# Patient Record
Sex: Female | Born: 1970 | Race: Black or African American | Hispanic: No | Marital: Married | Smoking: Never smoker
Health system: Southern US, Community
[De-identification: ages and names within clinical notes are randomized; demographics above are authoritative.]

## PROBLEM LIST (undated history)

## (undated) ENCOUNTER — Emergency Department (HOSPITAL_COMMUNITY): Admission: EM | Payer: BC Managed Care – PPO

## (undated) DIAGNOSIS — J45909 Unspecified asthma, uncomplicated: Secondary | ICD-10-CM

## (undated) DIAGNOSIS — N809 Endometriosis, unspecified: Secondary | ICD-10-CM

## (undated) DIAGNOSIS — R519 Headache, unspecified: Secondary | ICD-10-CM

## (undated) DIAGNOSIS — F419 Anxiety disorder, unspecified: Secondary | ICD-10-CM

## (undated) DIAGNOSIS — N979 Female infertility, unspecified: Secondary | ICD-10-CM

## (undated) DIAGNOSIS — R87619 Unspecified abnormal cytological findings in specimens from cervix uteri: Secondary | ICD-10-CM

## (undated) DIAGNOSIS — K56609 Unspecified intestinal obstruction, unspecified as to partial versus complete obstruction: Secondary | ICD-10-CM

## (undated) DIAGNOSIS — D649 Anemia, unspecified: Secondary | ICD-10-CM

## (undated) HISTORY — DX: Female infertility, unspecified: N97.9

## (undated) HISTORY — DX: Unspecified intestinal obstruction, unspecified as to partial versus complete obstruction: K56.609

## (undated) HISTORY — PX: OTHER SURGICAL HISTORY: SHX169

## (undated) HISTORY — DX: Anemia, unspecified: D64.9

## (undated) HISTORY — PX: SMALL INTESTINE SURGERY: SHX150

## (undated) HISTORY — PX: SALPINGECTOMY: SHX328

## (undated) HISTORY — DX: Unspecified asthma, uncomplicated: J45.909

## (undated) HISTORY — DX: Unspecified abnormal cytological findings in specimens from cervix uteri: R87.619

---

## 2006-10-28 ENCOUNTER — Other Ambulatory Visit: Admission: RE | Admit: 2006-10-28 | Discharge: 2006-10-28 | Payer: Self-pay | Admitting: Obstetrics and Gynecology

## 2007-12-06 ENCOUNTER — Other Ambulatory Visit: Admission: RE | Admit: 2007-12-06 | Discharge: 2007-12-06 | Payer: Self-pay | Admitting: Obstetrics and Gynecology

## 2010-09-23 ENCOUNTER — Other Ambulatory Visit: Payer: Self-pay | Admitting: Certified Nurse Midwife

## 2010-09-23 DIAGNOSIS — Z1231 Encounter for screening mammogram for malignant neoplasm of breast: Secondary | ICD-10-CM

## 2010-09-26 ENCOUNTER — Ambulatory Visit (HOSPITAL_COMMUNITY)
Admission: RE | Admit: 2010-09-26 | Discharge: 2010-09-26 | Disposition: A | Discharge status: Home / Self Care | Payer: BC Managed Care – PPO | Source: Ambulatory Visit | Attending: Certified Nurse Midwife | Admitting: Certified Nurse Midwife

## 2010-09-26 DIAGNOSIS — Z1231 Encounter for screening mammogram for malignant neoplasm of breast: Secondary | ICD-10-CM | POA: Insufficient documentation

## 2011-09-19 ENCOUNTER — Other Ambulatory Visit: Payer: Self-pay | Admitting: Certified Nurse Midwife

## 2011-09-19 DIAGNOSIS — Z1231 Encounter for screening mammogram for malignant neoplasm of breast: Secondary | ICD-10-CM

## 2011-10-15 ENCOUNTER — Ambulatory Visit (HOSPITAL_COMMUNITY)
Admission: RE | Admit: 2011-10-15 | Discharge: 2011-10-15 | Disposition: A | Discharge status: Home / Self Care | Payer: BC Managed Care – PPO | Source: Ambulatory Visit | Attending: Certified Nurse Midwife | Admitting: Certified Nurse Midwife

## 2011-10-15 DIAGNOSIS — Z1231 Encounter for screening mammogram for malignant neoplasm of breast: Secondary | ICD-10-CM | POA: Insufficient documentation

## 2011-10-21 ENCOUNTER — Other Ambulatory Visit: Payer: Self-pay | Admitting: Certified Nurse Midwife

## 2011-10-21 DIAGNOSIS — R928 Other abnormal and inconclusive findings on diagnostic imaging of breast: Secondary | ICD-10-CM

## 2011-10-24 ENCOUNTER — Ambulatory Visit
Admission: RE | Admit: 2011-10-24 | Discharge: 2011-10-24 | Disposition: A | Discharge status: Home / Self Care | Payer: BC Managed Care – PPO | Source: Ambulatory Visit | Attending: Certified Nurse Midwife | Admitting: Certified Nurse Midwife

## 2011-10-24 DIAGNOSIS — R928 Other abnormal and inconclusive findings on diagnostic imaging of breast: Secondary | ICD-10-CM

## 2012-06-02 ENCOUNTER — Telehealth: Payer: Self-pay | Admitting: Certified Nurse Midwife

## 2012-06-02 NOTE — Telephone Encounter (Signed)
She can ask the pharmacy to fill generic and they will offer what is available

## 2012-06-02 NOTE — Telephone Encounter (Signed)
Patient calling to see if there is a generic rx for microgestin?/please advise/[facility] [address]

## 2012-06-03 NOTE — Telephone Encounter (Signed)
Pt is aware and will ask pharmacy for generic.

## 2012-06-28 ENCOUNTER — Other Ambulatory Visit: Payer: Self-pay | Admitting: *Deleted

## 2012-06-28 MED ORDER — NORETHIN ACE-ETH ESTRAD-FE 1.5-30 MG-MCG PO TABS: 1.0000 | ORAL_TABLET | Freq: Every day | ORAL | Status: DC

## 2012-06-28 NOTE — Telephone Encounter (Signed)
Faxed refill request received from pharmacy for LOESTRIN FE 1.5/30  Last filled by MD on 10/21/11, #28 X 1YEAR Last AEX - 10/21/11 Next AEX - 10/21/12 Pt converting to mail order pharmacy.  90 day supply sent.

## 2012-10-04 ENCOUNTER — Encounter: Payer: Self-pay | Admitting: Certified Nurse Midwife

## 2012-10-15 ENCOUNTER — Telehealth: Payer: Self-pay

## 2012-10-15 NOTE — Telephone Encounter (Signed)
10-24-11 pt went for left breast u/s & they wanted pt to come back in for mth f/u.[facility],pt has not come in for that. I called the patient & lmtcb

## 2012-10-15 NOTE — Telephone Encounter (Signed)
Pt states he insurance had changed & she never made an appt to get it done. 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 Pt has aex on 10/26/12 & will discuss this with D,Leonard at her appt

## 2012-10-21 ENCOUNTER — Ambulatory Visit: Payer: Self-pay | Admitting: Certified Nurse Midwife

## 2012-10-26 ENCOUNTER — Telehealth: Payer: Self-pay

## 2012-10-26 ENCOUNTER — Ambulatory Visit: Payer: Self-pay | Admitting: Certified Nurse Midwife

## 2012-10-26 NOTE — Telephone Encounter (Signed)
See previous phone note from 10-14-12 for documetation that patient was contacted regarding the MMG.

## 2012-10-26 NOTE — Telephone Encounter (Signed)
MMG letter to your office to sign.  Agree?

## 2012-10-26 NOTE — Telephone Encounter (Signed)
See next phone note from 10-26-12.  Patient did not come in today for AEX.

## 2012-10-26 NOTE — Telephone Encounter (Signed)
Pt is past due for mammo. Pt did not come for aex today to discuss her failure to follow up for mammo. Pt cancelled.

## 2012-10-29 ENCOUNTER — Encounter: Payer: Self-pay | Admitting: Obstetrics & Gynecology

## 2012-11-01 NOTE — Telephone Encounter (Signed)
Letter written and mailed 10/30/12.

## 2012-11-23 ENCOUNTER — Encounter: Payer: Self-pay | Admitting: Certified Nurse Midwife

## 2012-11-25 ENCOUNTER — Encounter: Payer: Self-pay | Admitting: Certified Nurse Midwife

## 2012-11-25 ENCOUNTER — Ambulatory Visit: Payer: Self-pay | Admitting: Certified Nurse Midwife

## 2012-12-13 ENCOUNTER — Ambulatory Visit: Payer: Self-pay | Admitting: Certified Nurse Midwife

## 2012-12-14 ENCOUNTER — Ambulatory Visit: Payer: Self-pay | Admitting: Certified Nurse Midwife

## 2012-12-15 ENCOUNTER — Ambulatory Visit: Payer: Self-pay | Admitting: Certified Nurse Midwife

## 2012-12-16 ENCOUNTER — Encounter: Payer: Self-pay | Admitting: Certified Nurse Midwife

## 2012-12-16 ENCOUNTER — Ambulatory Visit (INDEPENDENT_AMBULATORY_CARE_PROVIDER_SITE_OTHER): Payer: BC Managed Care – PPO | Admitting: Certified Nurse Midwife

## 2012-12-16 VITALS — BP 114/68 | HR 62 | Resp 16 | Ht 64.5 in | Wt 142.0 lb

## 2012-12-16 DIAGNOSIS — Z309 Encounter for contraceptive management, unspecified: Secondary | ICD-10-CM

## 2012-12-16 DIAGNOSIS — N9489 Other specified conditions associated with female genital organs and menstrual cycle: Secondary | ICD-10-CM

## 2012-12-16 DIAGNOSIS — Z0000 Encounter for general adult medical examination without abnormal findings: Secondary | ICD-10-CM

## 2012-12-16 DIAGNOSIS — Z01419 Encounter for gynecological examination (general) (routine) without abnormal findings: Secondary | ICD-10-CM

## 2012-12-16 LAB — POCT URINALYSIS DIPSTICK

## 2012-12-16 MED ORDER — DROSPIRENONE-ETHINYL ESTRADIOL 3-0.02 MG PO TABS: 1.0000 | ORAL_TABLET | Freq: Every day | ORAL | Status: DC

## 2012-12-16 NOTE — Patient Instructions (Signed)

## 2012-12-16 NOTE — Progress Notes (Signed)
42 y.o. G2P0020 Single African American Fe here for annual exam. Periods normal. Patient complaining of midcycle pain with 1-2 two weeks duration off and on in past few months. Takes OTC for discomfort 8 Advil with relief. Denies bleeding when occurs. Describes pain as "crampy". No nausea or diarrhea with. Patient also wants to change OCP back to [facility], she felt her PMS was better on. No STD screening desired. Sees PCP prn. No other health issues today. Denies UTI symptoms or vaginal symptoms today. Patient aware she was to have breast follow up with concern of asymetry noted. Patient to schedule today. Declines our office scheduling.  Patient's last menstrual period was 11/27/2012.          Sexually active: yes  The current method of family planning is OCP (estrogen/progesterone).    Exercising: yes  The patient does not participate in regular exercise at present. Smoker:  no  Health Maintenance: Pap:  10/21/11 NEG HR HPV MMG:  10/24/11 BI-Rads 3 Colonoscopy:  none BMD:   none TDaP:  10/21/2011 Labs: Hgb: unable to draw ;  Urine: Leuks 2     Past Medical History  Diagnosis Date  . Asthma     exercise induced  . Anemia   . Infertility, female     Past Surgical History  Procedure Laterality Date  . Salpingectomy      left due to ectopic  . Other surgical history      salpingo occluded rt side, repair?    Current Outpatient Prescriptions  Medication Sig Dispense Refill  . Multiple Vitamins-Minerals (MULTIVITAMIN PO) Take by mouth daily.      . norethindrone-ethinyl estradiol-iron (MICROGESTIN FE,GILDESS FE,LOESTRIN FE) 1.5-30 MG-MCG tablet Take 1 tablet by mouth daily.  3 Package  0   No current facility-administered medications for this visit.    Family History  Problem Relation Age of Onset  . Cancer Maternal Aunt     colon  . Cancer Maternal Grandmother     stomach    ROS:  Pertinent items are noted in HPI.  Otherwise, a comprehensive ROS was negative.  Exam:   LMP  11/27/2012    Ht Readings from Last 3 Encounters:  No data found for Ht    General appearance: alert, cooperative and appears stated age Head: Normocephalic, without obvious abnormality, atraumatic Neck: no adenopathy, supple, symmetrical, trachea midline and thyroid normal to inspection and palpation Lungs: clear to auscultation bilaterally negative CVAT Breasts: normal appearance, no masses or tenderness, No nipple retraction or dimpling, No nipple discharge or bleeding, No axillary or supraclavicular adenopathy Heart: regular rate and rhythm Abdomen: soft, non-tender; no masses,  no organomegaly Extremities: extremities normal, atraumatic, no cyanosis or edema Skin: Skin color, texture, turgor normal. No rashes or lesions Lymph nodes: Cervical, supraclavicular, and axillary nodes normal. No abnormal inguinal nodes palpated Neurologic: Grossly normal   Pelvic: External genitalia:  no lesions              Urethra:  normal appearing urethra with no masses, tenderness or lesions, bladder non tender              Bartholin's and Skene's: normal                 Vagina: normal appearing vagina with normal color and discharge, no lesions              Cervix: normal, non tender              Pap taken:  no Bimanual Exam:  Uterus:  mid position              Adnexa: normal adnexa, no mass, fullness, tenderness and but left adnexal difficult to palpate with fullness ? mass               Rectovaginal: Confirms               Anus:  normal sphincter tone, no lesions  A:  Well Woman with normal exam  Contraception OCP change desired back to [facility] for PMS  symptoms  Left adnexal mass ?  Mammogram follow up past due for asymetry  P:   Reviewed health and wellness pertinent to exam  Rx Yaz see order  Reviewed findings and need for evaluation, patient agreeable.  Reviewed pelvic pain warning signs and need to advise. Will schedule PUS and patient will be notified if Dr Farrel Gobble in agreement.  Pap  smear as per guidelines   Mammogram yearly stressed doing follow up pap smear not taken today  counseled on breast self exam, mammography screening, use and side effects of OCP's, adequate intake of calcium and vitamin D, diet and exercise  return annually or prn  An After Visit Summary was printed and given to the patient.

## 2012-12-17 NOTE — Progress Notes (Signed)
Note reviewed, agree with plan.  Brysten Reister, MD  

## 2012-12-20 ENCOUNTER — Other Ambulatory Visit: Payer: Self-pay | Admitting: Certified Nurse Midwife

## 2012-12-20 ENCOUNTER — Telehealth: Payer: Self-pay | Admitting: Certified Nurse Midwife

## 2012-12-20 DIAGNOSIS — Z1231 Encounter for screening mammogram for malignant neoplasm of breast: Secondary | ICD-10-CM

## 2012-12-20 DIAGNOSIS — Z01419 Encounter for gynecological examination (general) (routine) without abnormal findings: Secondary | ICD-10-CM

## 2012-12-20 DIAGNOSIS — Z309 Encounter for contraceptive management, unspecified: Secondary | ICD-10-CM

## 2012-12-20 MED ORDER — DROSPIRENONE-ETHINYL ESTRADIOL 3-0.02 MG PO TABS: 1.0000 | ORAL_TABLET | Freq: Every day | ORAL | Status: DC

## 2012-12-20 NOTE — Telephone Encounter (Signed)
pt states her pharmacy does not have a pres on file for her for generic yaz please call this into [facility] [address]

## 2012-12-20 NOTE — Telephone Encounter (Signed)
12/16/12 rx was sent to [facility] of [facility] rx was resent to [facility] on [address]. Called Pharmacy to make sure rx was received, pharmacy confirmed. LM on patient's VM that rx was sent.

## 2012-12-23 ENCOUNTER — Other Ambulatory Visit: Payer: Self-pay

## 2013-01-04 ENCOUNTER — Other Ambulatory Visit: Payer: Self-pay | Admitting: Certified Nurse Midwife

## 2013-01-04 ENCOUNTER — Ambulatory Visit (HOSPITAL_COMMUNITY)
Admission: RE | Admit: 2013-01-04 | Discharge: 2013-01-04 | Disposition: A | Discharge status: Home / Self Care | Payer: BC Managed Care – PPO | Source: Ambulatory Visit | Attending: Certified Nurse Midwife | Admitting: Certified Nurse Midwife

## 2013-01-04 DIAGNOSIS — N6489 Other specified disorders of breast: Secondary | ICD-10-CM

## 2013-01-04 DIAGNOSIS — Z1231 Encounter for screening mammogram for malignant neoplasm of breast: Secondary | ICD-10-CM

## 2013-01-19 ENCOUNTER — Ambulatory Visit
Admission: RE | Admit: 2013-01-19 | Discharge: 2013-01-19 | Disposition: A | Discharge status: Home / Self Care | Payer: BC Managed Care – PPO | Source: Ambulatory Visit | Attending: Certified Nurse Midwife | Admitting: Certified Nurse Midwife

## 2013-01-19 DIAGNOSIS — N6489 Other specified disorders of breast: Secondary | ICD-10-CM

## 2013-11-21 ENCOUNTER — Other Ambulatory Visit: Payer: Self-pay | Admitting: Certified Nurse Midwife

## 2013-11-21 NOTE — Telephone Encounter (Signed)
Last refilled: 12/20/12 #1/12 refills by Ms. Debbie Last AEX: 12/16/12 with Ms. Debbie Aex scheduled for 12/22/13 with Ms. Debbie Last Mammogram: 01/19/13 Bi-Rads 1 Quincy Carnes 3/0.02 #28/2 refills sent to [facility] to last patient until AEX

## 2013-12-19 ENCOUNTER — Encounter: Payer: Self-pay | Admitting: Certified Nurse Midwife

## 2013-12-22 ENCOUNTER — Ambulatory Visit: Payer: BC Managed Care – PPO | Admitting: Certified Nurse Midwife

## 2014-01-31 ENCOUNTER — Encounter: Payer: Self-pay | Admitting: Certified Nurse Midwife

## 2014-01-31 ENCOUNTER — Ambulatory Visit (INDEPENDENT_AMBULATORY_CARE_PROVIDER_SITE_OTHER): Payer: BC Managed Care – PPO | Admitting: Certified Nurse Midwife

## 2014-01-31 VITALS — BP 110/70 | HR 68 | Resp 16 | Ht 63.25 in | Wt 137.0 lb

## 2014-01-31 DIAGNOSIS — Z3041 Encounter for surveillance of contraceptive pills: Secondary | ICD-10-CM

## 2014-01-31 DIAGNOSIS — Z01419 Encounter for gynecological examination (general) (routine) without abnormal findings: Secondary | ICD-10-CM

## 2014-01-31 DIAGNOSIS — Z124 Encounter for screening for malignant neoplasm of cervix: Secondary | ICD-10-CM

## 2014-01-31 MED ORDER — DROSPIRENONE-ETHINYL ESTRADIOL 3-0.02 MG PO TABS: 1.0000 | ORAL_TABLET | Freq: Every day | ORAL | Status: DC

## 2014-01-31 NOTE — Patient Instructions (Signed)

## 2014-01-31 NOTE — Progress Notes (Signed)
43 y.o. G2P0020 Single African American Fe here for annual exam.  Periods normal no issues. OCP working well. No health issues today. Getting married next year! Establishing with PCP and will have labs then. No health issues today.  Patient's last menstrual period was 01/31/2014.          Sexually active: Yes.    The current method of family planning is OCP (estrogen/progesterone).    Exercising: Yes.    walking Smoker:  no  Health Maintenance: Pap:  10-21-11 neg HPV HR neg MMG:  01-19-13 density category c, birads 1:neg will schedule Colonoscopy: none BMD:   none TDaP:  2013 Labs: none Self breast exam: done monthly   reports that she has never smoked. She does not have any smokeless tobacco history on file. She reports that she does not drink alcohol or use illicit drugs.  Past Medical History  Diagnosis Date  . Asthma     exercise induced  . Anemia   . Infertility, female     Past Surgical History  Procedure Laterality Date  . Salpingectomy      left due to ectopic  . Other surgical history      salpingo occluded rt side, repair?    Current Outpatient Prescriptions  Medication Sig Dispense Refill  . cyclobenzaprine (FLEXERIL) 10 MG tablet as needed.  2  . GIANVI 3-0.02 MG tablet TAKE 1 TABLET BY MOUTH DAILY 28 tablet 6  . meloxicam (MOBIC) 15 MG tablet as needed.  2  . Multiple Vitamins-Minerals (MULTIVITAMIN PO) Take by mouth daily.     No current facility-administered medications for this visit.    Family History  Problem Relation Age of Onset  . Cancer Maternal Aunt     colon  . Cancer Maternal Grandmother     stomach    ROS:  Pertinent items are noted in HPI.  Otherwise, a comprehensive ROS was negative.  Exam:   BP 110/70 mmHg  Pulse 68  Resp 16  Ht 5' 3.25" (1.607 m)  Wt 137 lb (62.143 kg)  BMI 24.06 kg/m2  LMP 01/31/2014 Height: 5' 3.25" (160.7 cm)  Ht Readings from Last 3 Encounters:  01/31/14 5' 3.25" (1.607 m)  12/16/12 5' 4.5" (1.638 m)     General appearance: alert, cooperative and appears stated age Head: Normocephalic, without obvious abnormality, atraumatic Neck: no adenopathy, supple, symmetrical, trachea midline and thyroid normal to inspection and palpation Lungs: clear to auscultation bilaterally Breasts: normal appearance, no masses or tenderness, No nipple retraction or dimpling, No nipple discharge or bleeding, No axillary or supraclavicular adenopathy Heart: regular rate and rhythm Abdomen: soft, non-tender; no masses,  no organomegaly Extremities: extremities normal, atraumatic, no cyanosis or edema Skin: Skin color, texture, turgor normal. No rashes or lesions Lymph nodes: Cervical, supraclavicular, and axillary nodes normal. No abnormal inguinal nodes palpated Neurologic: Grossly normal   Pelvic: External genitalia:  no lesions              Urethra:  normal appearing urethra with no masses, tenderness or lesions              Bartholin's and Skene's: normal                 Vagina: normal appearing vagina with normal color and discharge, no lesions              Cervix: normal,non tender,no lesions              Pap taken: Yes.  Bimanual Exam:  Uterus:  normal size, contour, position, consistency, mobility, non-tender              Adnexa: normal adnexa and no mass, fullness, tenderness               Rectovaginal: Confirms               Anus:  normal sphincter tone, no lesions  A:  Well Woman with normal exam  Contraception OCP desired  Mammogram due plans to schedule  P:   Reviewed health and wellness pertinent to exam  Rx Gianvi see order  Pap smear taken today with HPV reflex   counseled on breast self exam, mammography screening, use and side effects of OCP's, adequate intake of calcium and vitamin D, diet and exercise  return annually or prn  An After Visit Summary was printed and given to the patient.

## 2014-02-01 NOTE — Progress Notes (Signed)
Reviewed personally.  M. Suzanne Ellen Mayol, MD.  

## 2014-02-02 LAB — IPS PAP TEST WITH REFLEX TO HPV

## 2014-07-24 ENCOUNTER — Telehealth: Payer: Self-pay | Admitting: Certified Nurse Midwife

## 2014-07-24 DIAGNOSIS — Z3041 Encounter for surveillance of contraceptive pills: Secondary | ICD-10-CM

## 2014-07-24 MED ORDER — DROSPIRENONE-ETHINYL ESTRADIOL 3-0.02 MG PO TABS: 1.0000 | ORAL_TABLET | Freq: Every day | ORAL | Status: DC

## 2014-07-24 NOTE — Telephone Encounter (Signed)
Spoke with patient. Patient states that she needs new rx for [facility] sent to [facility]. Patient is going to be switching insurance companies and would like to pay cash for prescription. States pharmacy need new rx written for her to be able to pay cash. New rx placed for Gianvi #1 7RF until next aex.  Routing to provider for final review. Patient agreeable to disposition. Will close encounter.

## 2014-07-24 NOTE — Telephone Encounter (Signed)
Patient states she needs to get a new updated prescription refill for medication Gianvi with new insurance and it needs to be sent to [facility] address [address] [place]. Patient ok for cal back

## 2015-02-22 ENCOUNTER — Ambulatory Visit: Payer: BC Managed Care – PPO | Admitting: Certified Nurse Midwife

## 2015-09-28 ENCOUNTER — Encounter: Payer: Self-pay | Admitting: Certified Nurse Midwife

## 2015-09-28 ENCOUNTER — Ambulatory Visit (INDEPENDENT_AMBULATORY_CARE_PROVIDER_SITE_OTHER): Payer: BLUE CROSS/BLUE SHIELD | Admitting: Certified Nurse Midwife

## 2015-09-28 VITALS — BP 102/64 | HR 68 | Resp 16 | Ht 63.75 in | Wt 130.0 lb

## 2015-09-28 DIAGNOSIS — Z3041 Encounter for surveillance of contraceptive pills: Secondary | ICD-10-CM

## 2015-09-28 DIAGNOSIS — Z0000 Encounter for general adult medical examination without abnormal findings: Secondary | ICD-10-CM | POA: Diagnosis not present

## 2015-09-28 DIAGNOSIS — Z01419 Encounter for gynecological examination (general) (routine) without abnormal findings: Secondary | ICD-10-CM | POA: Diagnosis not present

## 2015-09-28 DIAGNOSIS — Z124 Encounter for screening for malignant neoplasm of cervix: Secondary | ICD-10-CM | POA: Diagnosis not present

## 2015-09-28 LAB — LIPID PANEL
CHOLESTEROL: 171 mg/dL (ref 125–200)
HDL: 80 mg/dL (ref >=46)
LDL Cholesterol: 81 mg/dL (ref <130)
TRIGLYCERIDES: 50 mg/dL (ref <150)
Total CHOL/HDL Ratio: 2.1 Ratio (ref <=5.0)
VLDL: 10 mg/dL (ref <30)

## 2015-09-28 LAB — TSH: TSH: 0.97 m[IU]/L

## 2015-09-28 MED ORDER — DROSPIRENONE-ETHINYL ESTRADIOL 3-0.02 MG PO TABS: 1.0000 | ORAL_TABLET | Freq: Every day | ORAL | 12 refills | Status: DC

## 2015-09-28 NOTE — Progress Notes (Signed)
45 y.o. G2P0020 Married  African American Fe here for annual exam. Periods normal, no issues.Contraception working well. Healthy year! Had work related injury with sciatic nerve, which has improved. Sees Urgent Care. No other health issues. Planning vacation in [place] soon. Screening labs if needed.  Patient's last menstrual period was 09/16/2015 (exact date).          Sexually active: Yes.    The current method of family planning is condoms all the time.    Exercising: Yes.    walking Smoker:  no  Health Maintenance: Pap:  01-31-14 neg MMG:  01-19-13 density category c birads 1:neg Colonoscopy:  none BMD:   none TDaP:  2013  Shingles: no Pneumonia: no Hep C and HIV: not done Labs: none Self breast exam: done monthly   reports that she has never smoked. She has never used smokeless tobacco. She reports that she does not drink alcohol or use drugs.  Past Medical History:  Diagnosis Date  . Anemia   . Asthma    exercise induced  . Infertility, female     Past Surgical History:  Procedure Laterality Date  . OTHER SURGICAL HISTORY     salpingo occluded rt side, repair?  Marland Kitchen SALPINGECTOMY     left due to ectopic    Current Outpatient Prescriptions  Medication Sig Dispense Refill  . cyclobenzaprine (FLEXERIL) 10 MG tablet as needed.  2   No current facility-administered medications for this visit.     Family History  Problem Relation Age of Onset  . Cancer Maternal Aunt     colon  . Cancer Maternal Grandmother     stomach    ROS:  Pertinent items are noted in HPI.  Otherwise, a comprehensive ROS was negative.  Exam:   BP 102/64   Pulse 68   Resp 16   Ht 5' 3.75" (1.619 m)   Wt 130 lb (59 kg)   LMP 09/16/2015 (Exact Date)   BMI 22.49 kg/m  Height: 5' 3.75" (161.9 cm) Ht Readings from Last 3 Encounters:  09/28/15 5' 3.75" (1.619 m)  01/31/14 5' 3.25" (1.607 m)  12/16/12 5' 4.5" (1.638 m)    General appearance: alert, cooperative and appears stated age Head:  Normocephalic, without obvious abnormality, atraumatic Neck: no adenopathy, supple, symmetrical, trachea midline and thyroid normal to inspection and palpation Lungs: clear to auscultation bilaterally Breasts: normal appearance, no masses or tenderness, No nipple retraction or dimpling, No nipple discharge or bleeding, No axillary or supraclavicular adenopathy, pendulous breasts Heart: regular rate and rhythm Abdomen: soft, non-tender; no masses,  no organomegaly Extremities: extremities normal, atraumatic, no cyanosis or edema Skin: Skin color, texture, turgor normal. No rashes or lesions Lymph nodes: Cervical, supraclavicular, and axillary nodes normal. No abnormal inguinal nodes palpated Neurologic: Grossly normal   Pelvic: External genitalia:  no lesions              Urethra:  normal appearing urethra with no masses, tenderness or lesions              Bartholin's and Skene's: normal                 Vagina: normal appearing vagina with normal color and discharge, no lesions              Cervix: no cervical motion tenderness, no lesions and normal              Pap taken: Yes.   Bimanual Exam:  Uterus:  normal  size, contour, position, consistency, mobility, non-tender              Adnexa: normal adnexa and no mass, fullness, tenderness               Rectovaginal: Confirms               Anus:  normal sphincter tone, no lesions  Chaperone present: yes  A:  Well Woman with normal exam  Contraception condoms  Screening labs  Mammogram due, patient will schedule  P:   Reviewed health and wellness pertinent to exam  Lab: TSH,Lipid panel, Vitamin D  Pap smear as above with HPVHR   counseled on breast self exam, mammography screening, adequate intake of calcium and vitamin D, diet and exercise  return annually or prn  An After Visit Summary was printed and given to the patient.

## 2015-09-28 NOTE — Patient Instructions (Signed)

## 2015-09-29 LAB — VITAMIN D 25 HYDROXY (VIT D DEFICIENCY, FRACTURES): Vit D, 25-Hydroxy: 12 ng/mL — ABNORMAL LOW (ref 30–100)

## 2015-10-02 ENCOUNTER — Other Ambulatory Visit: Payer: Self-pay

## 2015-10-02 DIAGNOSIS — E559 Vitamin D deficiency, unspecified: Secondary | ICD-10-CM

## 2015-10-02 LAB — IPS PAP TEST WITH HPV

## 2015-10-02 MED ORDER — VITAMIN D (ERGOCALCIFEROL) 1.25 MG (50000 UNIT) PO CAPS: 50000.0000 [IU] | ORAL_CAPSULE | ORAL | 0 refills | Status: DC

## 2015-10-02 NOTE — Progress Notes (Signed)
Encounter reviewed Jill Jertson, MD   

## 2015-11-19 ENCOUNTER — Telehealth: Payer: Self-pay | Admitting: Certified Nurse Midwife

## 2015-11-19 DIAGNOSIS — Z3041 Encounter for surveillance of contraceptive pills: Secondary | ICD-10-CM

## 2015-11-19 MED ORDER — DROSPIRENONE-ETHINYL ESTRADIOL 3-0.02 MG PO TABS: 1.0000 | ORAL_TABLET | Freq: Every day | ORAL | 4 refills | Status: DC

## 2015-11-19 NOTE — Telephone Encounter (Signed)
Medication refill request: Angela Glover Last AEX:  09/28/15 DL Next AEX: 10/03/16 Last MMG (if hormonal medication request): 01/19/13 [id number] negative Refill authorized: 90-day supply request from [facility]

## 2015-11-19 NOTE — Telephone Encounter (Signed)
Patient is calling about a mail order form she sent in 2 weeks ago. She states her prescription for Angela Glover was sent to [facility] but she needs it to go to [facility] order.

## 2016-01-02 ENCOUNTER — Other Ambulatory Visit: Payer: BLUE CROSS/BLUE SHIELD

## 2016-01-03 ENCOUNTER — Other Ambulatory Visit (INDEPENDENT_AMBULATORY_CARE_PROVIDER_SITE_OTHER): Payer: BLUE CROSS/BLUE SHIELD

## 2016-01-03 DIAGNOSIS — E559 Vitamin D deficiency, unspecified: Secondary | ICD-10-CM

## 2016-01-04 LAB — VITAMIN D 25 HYDROXY (VIT D DEFICIENCY, FRACTURES): Vit D, 25-Hydroxy: 47 ng/mL (ref 30–100)

## 2016-01-24 ENCOUNTER — Other Ambulatory Visit: Payer: Self-pay | Admitting: Certified Nurse Midwife

## 2016-01-24 DIAGNOSIS — Z1231 Encounter for screening mammogram for malignant neoplasm of breast: Secondary | ICD-10-CM

## 2016-02-20 ENCOUNTER — Ambulatory Visit
Admission: RE | Admit: 2016-02-20 | Discharge: 2016-02-20 | Disposition: A | Discharge status: Home / Self Care | Payer: Self-pay | Source: Ambulatory Visit | Attending: Certified Nurse Midwife | Admitting: Certified Nurse Midwife

## 2016-02-20 DIAGNOSIS — Z1231 Encounter for screening mammogram for malignant neoplasm of breast: Secondary | ICD-10-CM

## 2016-04-08 ENCOUNTER — Telehealth: Payer: Self-pay | Admitting: Certified Nurse Midwife

## 2016-04-08 NOTE — Telephone Encounter (Signed)
Patient wanted to update her pharmacy in system. [facility] an online pharmacy at [phone].

## 2016-04-08 NOTE — Telephone Encounter (Signed)
Spoke with patient. Called to verify pharmacy, as the telephone number did not match. Pharmacy verified by patient and updated.   Routing to provider for final review. Patient is agreeable to disposition. Will close encounter.

## 2016-05-20 ENCOUNTER — Telehealth: Payer: Self-pay | Admitting: Certified Nurse Midwife

## 2016-05-20 DIAGNOSIS — Z3041 Encounter for surveillance of contraceptive pills: Secondary | ICD-10-CM

## 2016-05-20 MED ORDER — DROSPIRENONE-ETHINYL ESTRADIOL 3-0.02 MG PO TABS: 1.0000 | ORAL_TABLET | Freq: Every day | ORAL | 1 refills | Status: DC

## 2016-05-20 NOTE — Telephone Encounter (Signed)
Spoke with patient. Patient states she has a new mail pharmacy and can not transfer prescription, would like new prescription sent for remaining refills of OCP Gianvi. Advised patient previous RX discontinued at [facility], new order placed for Gianvi #3pkg/1 RF at [facility]. Advised patient to f/u with pharmacy for filling, keep AEX for refills. Patient verbalizes understanding and is agreeable.  Routing to provider for final review. Patient is agreeable to disposition. Will close encounter.

## 2016-05-20 NOTE — Telephone Encounter (Signed)
Patient has a new insurance and is having trouble getting her refills through the mail order pharmacy on file. Patient said  "I need  an "original prescription to submit to my mail order pharmacy"

## 2016-05-23 ENCOUNTER — Ambulatory Visit (INDEPENDENT_AMBULATORY_CARE_PROVIDER_SITE_OTHER): Payer: Worker's Compensation | Admitting: Urgent Care

## 2016-05-23 ENCOUNTER — Encounter: Payer: Self-pay | Admitting: Urgent Care

## 2016-05-23 VITALS — BP 133/88 | HR 69 | Temp 98.2°F | Resp 16 | Ht 63.5 in | Wt 135.4 lb

## 2016-05-23 DIAGNOSIS — Z026 Encounter for examination for insurance purposes: Secondary | ICD-10-CM

## 2016-05-23 DIAGNOSIS — S46911A Strain of unspecified muscle, fascia and tendon at shoulder and upper arm level, right arm, initial encounter: Secondary | ICD-10-CM

## 2016-05-23 DIAGNOSIS — M62838 Other muscle spasm: Secondary | ICD-10-CM | POA: Diagnosis not present

## 2016-05-23 DIAGNOSIS — M25511 Pain in right shoulder: Secondary | ICD-10-CM | POA: Diagnosis not present

## 2016-05-23 MED ORDER — CYCLOBENZAPRINE HCL 5 MG PO TABS: 5.0000 mg | ORAL_TABLET | Freq: Three times a day (TID) | ORAL | 1 refills | Status: DC | PRN

## 2016-05-23 MED ORDER — NAPROXEN SODIUM 550 MG PO TABS: 550.0000 mg | ORAL_TABLET | Freq: Two times a day (BID) | ORAL | 1 refills | Status: DC

## 2016-05-23 NOTE — Progress Notes (Signed)
   MRN: [id number] DOB: [birth year]  Subjective:   Angela Glover is a 46 y.o. female presenting for worker's comp visit.   Reports over-extending her right shoulder while at work today. Patient was sweeping, bent down reaching under conveyer belt and felt a pulling sensation. Started having achy pain that progressed to shooting pain from her trapezius/shoulder up to her lower base of neck. Has tried ibuprofen with minimal relief. Denies bruising, trauma, swelling, redness, warmth.   Angela Glover's medications list, allergies, past medical history and past surgical history were reviewed and excluded from this note due to being a worker's comp case.   Objective:   Vitals: BP 133/88   Pulse 69   Temp 98.2 F (36.8 C) (Oral)   Resp 16   Ht 5' 3.5" (1.613 m)   Wt 135 lb 6.4 oz (61.4 kg)   LMP 05/08/2016   SpO2 100%   BMI 23.61 kg/m   Physical Exam  Constitutional: She is oriented to person, place, and time. She appears well-developed and well-nourished.  Cardiovascular: Normal rate.   Pulmonary/Chest: Effort normal.  Musculoskeletal:       Right shoulder: She exhibits decreased range of motion (external rotation), tenderness (over trapezius) and spasm (over right trapezius). She exhibits no bony tenderness, no swelling, no effusion, no crepitus, no deformity and normal strength.  Patient's pain not amenable to testing using Hawkins, Neer tests.   Neurological: She is alert and oriented to person, place, and time.  Skin: Skin is warm and dry.   Assessment and Plan :   1. Strain of right shoulder, initial encounter 2. Pain in joint of right shoulder 3. Encounter related to worker's compensation claim 4. Trapezius muscle spasm - Physical exam findings are reassuring, will start conservative management, work restrictions. Recheck in 1 week.  Angela Eagles, PA-C [facility] [phone] 05/23/2016 12:04 PM

## 2016-05-23 NOTE — Patient Instructions (Addendum)
Shoulder Pain Many things can cause shoulder pain, including:  An injury to the area.  Overuse of the shoulder.  Arthritis. The source of the pain can be:  Inflammation.  An injury to the shoulder joint.  An injury to a tendon, ligament, or bone. Follow these instructions at home: Take these actions to help with your pain:  Squeeze a soft ball or a foam pad as much as possible. This helps to keep the shoulder from swelling. It also helps to strengthen the arm.  Take over-the-counter and prescription medicines only as told by your health care provider.  If directed, apply ice to the area:  Put ice in a plastic bag.  Place a towel between your skin and the bag.  Leave the ice on for 20 minutes, 2-3 times per day. Stop applying ice if it does not help with the pain.  If you were given a shoulder sling or immobilizer:  Wear it as told.  Remove it to shower or bathe.  Move your arm as little as possible, but keep your hand moving to prevent swelling. Contact a health care provider if:  Your pain gets worse.  Your pain is not relieved with medicines.  New pain develops in your arm, hand, or fingers. Get help right away if:  Your arm, hand, or fingers:  Tingle.  Become numb.  Become swollen.  Become painful.  Turn white or blue. This information is not intended to replace advice given to you by your health care provider. Make sure you discuss any questions you have with your health care provider. Document Released: 11/13/2004 Document Revised: 09/30/2015 Document Reviewed: 05/29/2014 Elsevier Interactive Patient Education  2017 [employer].     IF you received an x-ray today, you will receive an invoice from [facility]. Please contact [facility] at [phone] with questions or concerns regarding your invoice.   IF you received labwork today, you will receive an invoice from [facility]. Please contact [facility] at [phone] with  questions or concerns regarding your invoice.   Our billing staff will not be able to assist you with questions regarding bills from these companies.  You will be contacted with the lab results as soon as they are available. The fastest way to get your results is to activate your My Chart account. Instructions are located on the last page of this paperwork. If you have not heard from [place] regarding the results in 2 weeks, please contact this office.

## 2016-05-30 ENCOUNTER — Other Ambulatory Visit: Payer: Self-pay

## 2016-10-03 ENCOUNTER — Ambulatory Visit: Payer: BLUE CROSS/BLUE SHIELD | Admitting: Certified Nurse Midwife

## 2016-10-16 ENCOUNTER — Ambulatory Visit: Payer: BLUE CROSS/BLUE SHIELD | Admitting: Certified Nurse Midwife

## 2016-10-16 ENCOUNTER — Ambulatory Visit (INDEPENDENT_AMBULATORY_CARE_PROVIDER_SITE_OTHER): Payer: BLUE CROSS/BLUE SHIELD | Admitting: Certified Nurse Midwife

## 2016-10-16 ENCOUNTER — Encounter: Payer: Self-pay | Admitting: Certified Nurse Midwife

## 2016-10-16 ENCOUNTER — Other Ambulatory Visit (HOSPITAL_COMMUNITY)
Admission: RE | Admit: 2016-10-16 | Discharge: 2016-10-16 | Disposition: A | Discharge status: Home / Self Care | Payer: BLUE CROSS/BLUE SHIELD | Source: Ambulatory Visit | Attending: Obstetrics & Gynecology | Admitting: Obstetrics & Gynecology

## 2016-10-16 VITALS — BP 110/70 | HR 68 | Resp 16 | Ht 63.5 in | Wt 134.0 lb

## 2016-10-16 DIAGNOSIS — Z01419 Encounter for gynecological examination (general) (routine) without abnormal findings: Secondary | ICD-10-CM | POA: Diagnosis not present

## 2016-10-16 DIAGNOSIS — Z124 Encounter for screening for malignant neoplasm of cervix: Secondary | ICD-10-CM | POA: Diagnosis not present

## 2016-10-16 DIAGNOSIS — N841 Polyp of cervix uteri: Secondary | ICD-10-CM | POA: Diagnosis not present

## 2016-10-16 DIAGNOSIS — M25569 Pain in unspecified knee: Secondary | ICD-10-CM | POA: Insufficient documentation

## 2016-10-16 DIAGNOSIS — M545 Low back pain, unspecified: Secondary | ICD-10-CM | POA: Insufficient documentation

## 2016-10-16 DIAGNOSIS — K649 Unspecified hemorrhoids: Secondary | ICD-10-CM | POA: Diagnosis not present

## 2016-10-16 DIAGNOSIS — Z3041 Encounter for surveillance of contraceptive pills: Secondary | ICD-10-CM | POA: Diagnosis not present

## 2016-10-16 MED ORDER — DROSPIRENONE-ETHINYL ESTRADIOL 3-0.02 MG PO TABS: 1.0000 | ORAL_TABLET | Freq: Every day | ORAL | 4 refills | Status: DC

## 2016-10-16 NOTE — Patient Instructions (Signed)

## 2016-10-16 NOTE — Progress Notes (Signed)
46 y.o. G2P0020 Married  African American Fe here for annual exam. Amenorrhea with OCP use.OCP working well. Complaining of rectal pressure and pain with standing long period times. Denies diarrhea or constipation. 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 Has been drinking gatorade all day to avoid dehydration. Feels she has been retaining fluid, but has increased water intake and this is better. Sees urgent care if needed. No other health issues today.  Patient's last menstrual period was 10/05/2016 (exact date).          Sexually active: Yes.    The current method of family planning is OCP (estrogen/progesterone).    Exercising: Yes.    walking, run, lifting Smoker:  no  Health Maintenance: Pap:  01-31-14 neg, 09-28-15 neg HPV HR neg History of Abnormal Pap: yes MMG: 02-20-16 category c density birads 1:neg Self Breast exams: yes Colonoscopy:  none BMD:   none TDaP:  2013 Shingles: no Pneumonia: no Hep C and HIV: HIV done with insurance Labs: none   reports that she has never smoked. She has never used smokeless tobacco. She reports that she does not drink alcohol or use drugs.  Past Medical History:  Diagnosis Date  . Abnormal Pap smear of cervix   . Anemia   . Asthma    exercise induced  . Infertility, female     Past Surgical History:  Procedure Laterality Date  . OTHER SURGICAL HISTORY     salpingo occluded rt side, repair?  Marland Kitchen SALPINGECTOMY     left due to ectopic    Current Outpatient Prescriptions  Medication Sig Dispense Refill  . drospirenone-ethinyl estradiol (GIANVI) 3-0.02 MG tablet Take 1 tablet by mouth daily. 3 Package 4   No current facility-administered medications for this visit.     Family History  Problem Relation Age of Onset  . Cancer Maternal Aunt        colon  . Cancer Maternal Grandmother        stomach    ROS:  Pertinent items are noted in HPI.  Otherwise, a comprehensive ROS was negative.  Exam:   BP 110/70    Pulse 68   Resp 16   Ht 5' 3.5" (1.613 m)   Wt 134 lb (60.8 kg)   LMP 10/05/2016 (Exact Date)   BMI 23.36 kg/m  Height: 5' 3.5" (161.3 cm) Ht Readings from Last 3 Encounters:  10/16/16 5' 3.5" (1.613 m)  05/23/16 5' 3.5" (1.613 m)  09/28/15 5' 3.75" (1.619 m)    General appearance: alert, cooperative and appears stated age Head: Normocephalic, without obvious abnormality, atraumatic Neck: no adenopathy, supple, symmetrical, trachea midline and thyroid normal to inspection and palpation Lungs: clear to auscultation bilaterally Breasts: normal appearance, no masses or tenderness, No nipple retraction or dimpling, No nipple discharge or bleeding, No axillary or supraclavicular adenopathy Heart: regular rate and rhythm Abdomen: soft, non-tender; no masses,  no organomegaly Extremities: extremities normal, atraumatic, no cyanosis or edema Skin: Skin color, texture, turgor normal. No rashes or lesions Lymph nodes: Cervical, supraclavicular, and axillary nodes normal. No abnormal inguinal nodes palpated Neurologic: Grossly normal   Pelvic: External genitalia:  no lesions              Urethra:  normal appearing urethra with no masses, tenderness or lesions              Bartholin's and Skene's: normal  Vagina: normal appearing vagina with normal color and discharge, no lesions              Cervix: no cervical motion tenderness and cervical 2 cm polyp noted, friable with  pap taken              Pap taken: Yes.   Bimanual Exam:  Uterus:  normal size, contour, position, consistency, mobility, non-tender              Adnexa: normal adnexa and no mass, fullness, tenderness               Rectovaginal: Confirms               Anus:  normal sphincter tone, no lesions  Chaperone present: yes  A:  Well Woman with normal exam  Contraception OCP desired  Cervical polyp noted  Non thrombosed hemorrhoid noted  P:   Reviewed health and wellness pertinent to exam  Rx Gianvi see  order with instructions  Discussed finding and will need removal. Discussed benign findings usually, but have some bleeding with sexual activity. Questions addressed. Patient will be called with insurance infor and scheduled for removal.  Discussed hemorrhoid finding and need for warm soaks to area, or tub bath, limit prolonged standing or sitting and heavy lifting. Avoid constipation, stool softener if needed. Warning signs of hemorrhoid given and need to advise.  Pap smear: yes   counseled on breast self exam, mammography screening, use and side effects of OCP's, adequate intake of calcium and vitamin D, diet and exercise  return annually or prn  An After Visit Summary was printed and given to the patient.

## 2016-10-17 ENCOUNTER — Telehealth: Payer: Self-pay | Admitting: *Deleted

## 2016-10-17 DIAGNOSIS — N841 Polyp of cervix uteri: Secondary | ICD-10-CM

## 2016-10-17 LAB — CYTOLOGY - PAP: Diagnosis: NEGATIVE

## 2016-10-17 NOTE — Telephone Encounter (Signed)
-----   Message from Regina Eck, CNM sent at 10/16/2016 12:54 PM EDT ----- Please schedule cervical polyp removal for this patient, pap smear pending so schedule out so it is back . Thank you

## 2016-10-17 NOTE — Addendum Note (Signed)
Addended by: Burnice Logan on: 10/17/2016 01:58 PM   Modules accepted: Orders

## 2016-10-17 NOTE — Telephone Encounter (Signed)
Spoke with patient. Call to schedule cervical polyp removal. LMP 10/05/16. OCP for contraceptive. Patient scheduled with Dr. Talbert Nan on 10/27/16 at [facility] to take Motrin 800 mg with food and water one hour before procedure. Patient verbalizes understanding and is agreeable.  Order placed for polypectomy.   Routing to provider for final review. Patient is agreeable to disposition. Will close encounter.   Cc: Lerry Liner; Melvia Heaps, CNM

## 2016-10-27 ENCOUNTER — Encounter: Payer: Self-pay | Admitting: Obstetrics and Gynecology

## 2016-10-27 ENCOUNTER — Ambulatory Visit (INDEPENDENT_AMBULATORY_CARE_PROVIDER_SITE_OTHER): Payer: BLUE CROSS/BLUE SHIELD | Admitting: Obstetrics and Gynecology

## 2016-10-27 VITALS — BP 110/78 | HR 76 | Resp 14 | Wt 135.0 lb

## 2016-10-27 DIAGNOSIS — N946 Dysmenorrhea, unspecified: Secondary | ICD-10-CM

## 2016-10-27 DIAGNOSIS — N841 Polyp of cervix uteri: Secondary | ICD-10-CM

## 2016-10-27 DIAGNOSIS — Z8742 Personal history of other diseases of the female genital tract: Secondary | ICD-10-CM | POA: Diagnosis not present

## 2016-10-27 DIAGNOSIS — N926 Irregular menstruation, unspecified: Secondary | ICD-10-CM

## 2016-10-27 DIAGNOSIS — N949 Unspecified condition associated with female genital organs and menstrual cycle: Secondary | ICD-10-CM

## 2016-10-27 LAB — POCT URINE PREGNANCY: Preg Test, Ur: NEGATIVE

## 2016-10-27 NOTE — Progress Notes (Addendum)
GYNECOLOGY  VISIT   HPI: 46 y.o.   Married  [place] American  female   [phone] with Patient's last menstrual period was 10/05/2016 (exact date).   here for cervical polyp removal. The patient was noted to have a large cervical polyp at her annual exam with Ms Hollice Espy.  The patient is on OCP's. Typically menses q month x 5 days. Saturates a pad in 3 hours. She has had lifelong horrible cramps. Takes 1,600 mg of ibuprofen BID for 3 days of her cycle. 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 No BTB, until this morning when she had a small amount of bleeding.  She is sexually active, one episode of postcoital bleeding.    She was told she had endometriosis at the time of laparotomy for an ectopic pregnancy in 2006.   GYNECOLOGIC HISTORY: Patient's last menstrual period was 10/05/2016 (exact date). Contraception:OCP Menopausal hormone therapy: none         OB History    Gravida Para Term Preterm AB Living   2 0 0 0 2 0   SAB TAB Ectopic Multiple Live Births   1   1             Patient Active Problem List   Diagnosis Date Noted  . Knee pain 10/16/2016  . Low back pain 10/16/2016    Past Medical History:  Diagnosis Date  . Abnormal Pap smear of cervix   . Anemia   . Asthma    exercise induced  . Infertility, female     Past Surgical History:  Procedure Laterality Date  . OTHER SURGICAL HISTORY     salpingo occluded rt side, repair?  Marland Kitchen SALPINGECTOMY     left due to ectopic  She was told she had endometriosis.   Current Outpatient Prescriptions  Medication Sig Dispense Refill  . drospirenone-ethinyl estradiol (GIANVI) 3-0.02 MG tablet Take 1 tablet by mouth daily. 3 Package 4   No current facility-administered medications for this visit.      ALLERGIES: Latex  Family History  Problem Relation Age of Onset  . Cancer Maternal Aunt        colon  . Cancer Maternal Grandmother        stomach    Social  History   Social History  . Marital status: Married    Spouse name: N/A  . Number of children: N/A  . Years of education: N/A   Occupational History  . Not on file.   Social History Main Topics  . Smoking status: Never Smoker  . Smokeless tobacco: Never Used  . Alcohol use No  . Drug use: No  . Sexual activity: Yes    Partners: Male    Birth control/ protection: Pill   Other Topics Concern  . Not on file   Social History Narrative  . No narrative on file    Review of Systems  Constitutional: Negative.   HENT: Negative.   Eyes: Negative.   Respiratory: Negative.   Cardiovascular: Negative.   Gastrointestinal: Negative.   Genitourinary:       Cervical polyp   Musculoskeletal: Negative.   Skin: Negative.   Neurological: Negative.   Psychiatric/Behavioral: Negative.     PHYSICAL EXAMINATION:    BP 110/78 (BP Location: Right Arm, Patient Position: Sitting, Cuff Size: Normal)   Pulse 76   Resp 14   Wt 135 lb (61.2 kg)   LMP 10/05/2016 (  Exact Date)   BMI 23.54 kg/m     General appearance: alert, cooperative and appears stated age  Pelvic: External genitalia:  no lesions              Urethra:  normal appearing urethra with no masses, tenderness or lesions              Bartholins and Skenes: normal                 Vagina: normal appearing vagina with normal color and discharge, no lesions              Cervix: cervical polyp, removed with ringed forceps, no cervical motion tenderness              Bimanual Exam:  Uterus:  retroverted, mobile, normal sized, mildly tender (but currently spotting)              Adnexa: fullness and tenderness in the left adnexa              Rectovaginal: Yes.  .  Confirms.              Anus:  normal sphincter tone, no lesions  Chaperone was present for exam.  ASSESSMENT Cervical polyp Severe dysmenorrhea (life long), taking too much ibuprofen History of endometriosis H/O ectopic, had laparotomy and salpingectomy Adnexal fullness  on the left    PLAN Cervical polypectomy Discussed options of depo-provera (declines), continuous OCP's, mirena IUD, laparoscopy and hysterectomy Will set up a gyn ultrasound Will try and get a copy of her surgery from 2006 Information on the IUD given    An After Visit Summary was printed and given to the patient.  CC: Evalee Mutton, CNM  Addendum: Op note from 2006 reviewed. She had a laparotomy and left salpingectomy for an ectopic pregnancy with lysis of adhesions. The uterus "had some thick filmy changes, consistent with chronic PID. "The right tube was adhered down onto the intestines. There were some hemosiderin appearing particles consistent with possible endometriosis"

## 2016-11-04 ENCOUNTER — Telehealth: Payer: Self-pay | Admitting: Obstetrics and Gynecology

## 2016-11-04 NOTE — Telephone Encounter (Signed)
Patient cancelled ultrasound appointment for 9/25. Came in today thinking it was for this Tuesday. Would like a call to reschedule.

## 2016-11-11 ENCOUNTER — Other Ambulatory Visit: Payer: Self-pay

## 2016-11-11 ENCOUNTER — Other Ambulatory Visit: Payer: Self-pay | Admitting: Obstetrics and Gynecology

## 2016-11-18 ENCOUNTER — Telehealth: Payer: Self-pay | Admitting: Obstetrics and Gynecology

## 2016-11-18 NOTE — Telephone Encounter (Signed)
See previous phone note. Patient is rescheduled for ultrasound on 11/25/16 with Dr Talbert Nan. Patient is aware appointment date, arrival time and cancellation policy. Patient had no further questions. Ok to close   cc: Dr Talbert Nan

## 2016-11-25 ENCOUNTER — Encounter: Payer: Self-pay | Admitting: Obstetrics and Gynecology

## 2016-11-25 ENCOUNTER — Ambulatory Visit (INDEPENDENT_AMBULATORY_CARE_PROVIDER_SITE_OTHER): Payer: BLUE CROSS/BLUE SHIELD | Admitting: Obstetrics and Gynecology

## 2016-11-25 ENCOUNTER — Ambulatory Visit (INDEPENDENT_AMBULATORY_CARE_PROVIDER_SITE_OTHER): Payer: BLUE CROSS/BLUE SHIELD

## 2016-11-25 VITALS — BP 112/60 | HR 72 | Resp 15 | Wt 134.0 lb

## 2016-11-25 DIAGNOSIS — N83202 Unspecified ovarian cyst, left side: Secondary | ICD-10-CM | POA: Diagnosis not present

## 2016-11-25 DIAGNOSIS — N949 Unspecified condition associated with female genital organs and menstrual cycle: Secondary | ICD-10-CM

## 2016-11-25 DIAGNOSIS — Z8742 Personal history of other diseases of the female genital tract: Secondary | ICD-10-CM

## 2016-11-25 DIAGNOSIS — N946 Dysmenorrhea, unspecified: Secondary | ICD-10-CM | POA: Diagnosis not present

## 2016-11-25 DIAGNOSIS — D259 Leiomyoma of uterus, unspecified: Secondary | ICD-10-CM

## 2016-11-25 MED ORDER — IBUPROFEN 800 MG PO TABS: 800.0000 mg | ORAL_TABLET | Freq: Three times a day (TID) | ORAL | 1 refills | Status: DC | PRN

## 2016-11-25 NOTE — Progress Notes (Signed)
GYNECOLOGY  VISIT   HPI: 46 y.o.   Married  [place] American  female   [phone] with Patient's last menstrual period was 11/24/2016.   here for follow up on severe dysmenorrhea. She is taking more than double the recommended amount of ibuprofen with her cycles even on OPC's. She has a known h/o endometriosis and a h/o an ectopic pregnancy (laparotomy and left salpingectomy). She has been spotting since her cervical polyp was removed last month. She is now on her cycle.   GYNECOLOGIC HISTORY: Patient's last menstrual period was 11/24/2016. Contraception:OCP Menopausal hormone therapy: none         OB History    Gravida Para Term Preterm AB Living   2 0 0 0 2 0   SAB TAB Ectopic Multiple Live Births   1   1             Patient Active Problem List   Diagnosis Date Noted  . Knee pain 10/16/2016  . Low back pain 10/16/2016    Past Medical History:  Diagnosis Date  . Abnormal Pap smear of cervix   . Anemia   . Asthma    exercise induced  . Infertility, female     Past Surgical History:  Procedure Laterality Date  . OTHER SURGICAL HISTORY     salpingo occluded rt side, repair?  Marland Kitchen SALPINGECTOMY     left due to ectopic    Current Outpatient Prescriptions  Medication Sig Dispense Refill  . drospirenone-ethinyl estradiol (GIANVI) 3-0.02 MG tablet Take 1 tablet by mouth daily. 3 Package 4   No current facility-administered medications for this visit.      ALLERGIES: Latex  Family History  Problem Relation Age of Onset  . Cancer Maternal Aunt        colon  . Cancer Maternal Grandmother        stomach    Social History   Social History  . Marital status: Married    Spouse name: N/A  . Number of children: N/A  . Years of education: N/A   Occupational History  . Not on file.   Social History Main Topics  . Smoking status: Never Smoker  . Smokeless tobacco: Never Used  . Alcohol use No  . Drug use: No  . Sexual activity: Yes    Partners: Male    Birth  control/ protection: Pill   Other Topics Concern  . Not on file   Social History Narrative  . No narrative on file    Review of Systems  Constitutional: Negative.   HENT: Negative.   Eyes: Negative.   Respiratory: Negative.   Cardiovascular: Negative.   Gastrointestinal: Negative.   Genitourinary:       Irregular menstrual cycles Heavy bleeding   Musculoskeletal: Negative.   Skin: Negative.   Neurological: Negative.   Endo/Heme/Allergies: Negative.   Psychiatric/Behavioral: Negative.     PHYSICAL EXAMINATION:    BP 112/60 (BP Location: Right Arm, Patient Position: Sitting, Cuff Size: Normal)   Pulse 72   Resp 15   Wt 134 lb (60.8 kg)   LMP 11/24/2016   BMI 23.36 kg/m     General appearance: alert, cooperative and appears stated age  Ultrasound images reviewed with the patient  ASSESSMENT Severe dysmenorrhea Fibroid uterus Complex left ovarian cyst, concerning     PLAN Discussed options of depo-provera, continuous OCP's, orlissa, lupron with add back therapy and surgery For now she will continue cyclic OCP's Information on Orlissa given Ibuprofen 800  mg sent, can add tylenol if needed Recommend a f/u ultrasound in 2 months to f/u on her ovarian cyst   An After Visit Summary was printed and given to the patient.  20 minutes face to face time of which over 50% was spent in counseling.   CC: Evalee Mutton, CNM

## 2016-12-02 ENCOUNTER — Telehealth: Payer: Self-pay | Admitting: Certified Nurse Midwife

## 2016-12-02 MED ORDER — YAZ 3-0.02 MG PO TABS: 1.0000 | ORAL_TABLET | Freq: Every day | ORAL | 3 refills | Status: DC

## 2016-12-02 NOTE — Telephone Encounter (Signed)
Patient would like to discuss switching birth control. °

## 2016-12-02 NOTE — Telephone Encounter (Signed)
Yes, please change it for her.

## 2016-12-02 NOTE — Telephone Encounter (Signed)
Spoke with patient. Advised RX for Yaz #3/3RF sent to verified pharmacy on file. Advised to f/u with pharmacy for filling. Patient verbalizes understanding and is agreeable. Will close encounter.

## 2016-12-02 NOTE — Telephone Encounter (Signed)
Spoke with patient. Patient requesting to switch OCP to Brand Yaz. Patient states she started [place] on 11/30/16 and does not feel generic is as effective for cramping and fibroid pain. Patient states she discussed treatment options with Dr. Talbert Nan at last [facility] on 11/25/16 and feels this is the best option for her at this time.   Advised patient will review with Dr. Talbert Nan and return call with recommendations, patient is agreeable.   Pharmacy -[facility]  Dr. Nelson Chimes -ok to send RX for Yaz, brand only #3/3RF?   Cc: Melvia Heaps, CNM

## 2017-01-19 ENCOUNTER — Telehealth: Payer: Self-pay | Admitting: Obstetrics and Gynecology

## 2017-01-19 NOTE — Telephone Encounter (Signed)
Call to patient. Advised calling to review recommendations for follow-up pelvic ultrasound to recheck 3.4 cm complex endometrial cyst. Patient states she has had this issue for years (confirmed this is not for her fibroids) and she cannot keep paying copay every time she needs an ultrasound.  Reviewed noted from Dr Talbert Nan regarding size of cyst, possible endometrioma and need to follow up to monitor for increase or decrease in size. Discussed the small chance of malignant transformation, especially if greater than 5 cm.  Patietn states she will have tho think about this as she has had this since age 46 and she is "not really concerned about this right now." Offered scheduling alternatives at location of choice and patient declined.

## 2017-01-19 NOTE — Telephone Encounter (Signed)
Patient call to cancel scheduled appointment on 01/27/17. Patient advises she did not realize the appointment was for a two month follow up ultrasound. Patient states she does not wish to reschedule. Advised patient I will forward this information to her doctor for review.   Routing to Dr Talbert Nan for review  cc: Triage Nurse

## 2017-01-19 NOTE — Telephone Encounter (Signed)
Please speak with the patient and explain that the reason for the f/u ultrasound is that she had a 3.4 cm complex left ovarian cyst (suspect endometrioma). While an endometrioma can be followed and not necessarily removed, I would recommend that we follow it. This includes short term f/u ultrasound to make sure that it isn't growing or changing and then f/u ultrasound in 6 months, and then yearly. There is small chance of malignant transformation of an endometrioma. If the cyst were to grow (particularly larger than 5 cm), or develop other concerning features, then surgery would be recommended.

## 2017-01-20 NOTE — Telephone Encounter (Signed)
Spoke with patient. PUS rescheduled for 01/27/17 at 1pm with consult to follow at 1:30pm with Dr. Talbert Nan. Patient is agreeable to date and time.   Routing to provider for final review. Patient is agreeable to disposition. Will close encounter.  Cc: Lamont Snowball, RN, Lerry Liner

## 2017-01-27 ENCOUNTER — Other Ambulatory Visit: Payer: Self-pay

## 2017-01-27 ENCOUNTER — Other Ambulatory Visit: Payer: Self-pay | Admitting: Obstetrics and Gynecology

## 2017-01-27 ENCOUNTER — Ambulatory Visit: Payer: BLUE CROSS/BLUE SHIELD | Admitting: Obstetrics and Gynecology

## 2017-01-27 ENCOUNTER — Encounter: Payer: Self-pay | Admitting: Obstetrics and Gynecology

## 2017-01-27 ENCOUNTER — Ambulatory Visit: Payer: BLUE CROSS/BLUE SHIELD

## 2017-01-27 VITALS — BP 100/60 | HR 88 | Resp 14 | Wt 136.0 lb

## 2017-01-27 DIAGNOSIS — N809 Endometriosis, unspecified: Secondary | ICD-10-CM

## 2017-01-27 DIAGNOSIS — N941 Unspecified dyspareunia: Secondary | ICD-10-CM

## 2017-01-27 DIAGNOSIS — N946 Dysmenorrhea, unspecified: Secondary | ICD-10-CM

## 2017-01-27 DIAGNOSIS — N83202 Unspecified ovarian cyst, left side: Secondary | ICD-10-CM

## 2017-01-27 DIAGNOSIS — Z8742 Personal history of other diseases of the female genital tract: Secondary | ICD-10-CM

## 2017-01-27 NOTE — Progress Notes (Signed)
GYNECOLOGY  VISIT   HPI: 46 y.o.   Married  [place] American  female   [phone] with Patient's last menstrual period was 01/19/2017.   here for follow up Severe dysmenorrhea. The patient has a known h/o endometriosis, also with a h/o an ectopic pregnancy (laparotomy and left salpingectomy). At her last visit in 10/18 she had an ultrasound which revealed a fibroid uterus and a 3.4 x 2 complex left ovarian cyst c/w an endometrioma (adherent to the side wall). She is on OCP's to help with cycle control. The last 2 cycles have been tolerable as far as cramps go. Cycles q month x 6 days. Saturates a pad in 6+ hours (much better). Only occasional spotting. Sexually active, intermittent deep dyspareunia.   GYNECOLOGIC HISTORY: Patient's last menstrual period was 01/19/2017. Contraception:OCP Menopausal hormone therapy: none         OB History    Gravida Para Term Preterm AB Living   2 0 0 0 2 0   SAB TAB Ectopic Multiple Live Births   1   1             Patient Active Problem List   Diagnosis Date Noted  . Knee pain 10/16/2016  . Low back pain 10/16/2016    Past Medical History:  Diagnosis Date  . Abnormal Pap smear of cervix   . Anemia   . Asthma    exercise induced  . Infertility, female     Past Surgical History:  Procedure Laterality Date  . OTHER SURGICAL HISTORY     salpingo occluded rt side, repair?  Marland Kitchen SALPINGECTOMY     left due to ectopic    Current Outpatient Medications  Medication Sig Dispense Refill  . ibuprofen (ADVIL,MOTRIN) 800 MG tablet Take 1 tablet (800 mg total) by mouth every 8 (eight) hours as needed. 30 tablet 1  . YAZ 3-0.02 MG tablet Take 1 tablet by mouth daily. 3 Package 3   No current facility-administered medications for this visit.      ALLERGIES: Latex  Family History  Problem Relation Age of Onset  . Cancer Maternal Aunt        colon  . Cancer Maternal Grandmother        stomach    Social History   Socioeconomic History  . Marital  status: Married    Spouse name: Not on file  . Number of children: Not on file  . Years of education: Not on file  . Highest education level: Not on file  Social Needs  . Financial resource strain: Not on file  . Food insecurity - worry: Not on file  . Food insecurity - inability: Not on file  . Transportation needs - medical: Not on file  . Transportation needs - non-medical: Not on file  Occupational History  . Not on file  Tobacco Use  . Smoking status: Never Smoker  . Smokeless tobacco: Never Used  Substance and Sexual Activity  . Alcohol use: No  . Drug use: No  . Sexual activity: Yes    Partners: Male    Birth control/protection: Pill  Other Topics Concern  . Not on file  Social History Narrative  . Not on file    Review of Systems  Constitutional: Negative.   HENT: Negative.   Eyes: Negative.   Respiratory: Negative.   Cardiovascular: Negative.   Gastrointestinal: Negative.   Genitourinary: Negative.   Musculoskeletal: Negative.   Skin: Negative.   Neurological: Negative.   Endo/Heme/Allergies: Negative.  Psychiatric/Behavioral: Negative.     PHYSICAL EXAMINATION:    BP 100/60 (BP Location: Right Arm, Patient Position: Sitting, Cuff Size: Normal)   Pulse 88   Resp 14   Wt 136 lb (61.7 kg)   LMP 01/19/2017   BMI 23.71 kg/m     General appearance: alert, cooperative and appears stated age  Ultrasound images were reviewed with the patient. The left complex ovarian cyst is stable. Fibroid uterus, no changes.   ASSESSMENT Endometriosis Severe dysmenorrhea, currently improved on OCPs H/O abnormal bleeding, resolved since her cervical polyp was removed in 9/18 Complex left ovarian cyst, 3.5 x 2.3 cm, c/w endometrioma Dyspareunia, intermittent and deep    PLAN Continue OCP's Plan f/u ultrasound in 6 months to document stability of the ovarian cyst. If remains stable will space out ultrasound to yearly Due for an annual in 8/18 Recommended she  control the rate and depth of penetration with intercourse. Discussed positions that would lessen her discomfort.  She has ibuprofen and tylenol for pain   An After Visit Summary was printed and given to the patient.

## 2017-02-23 ENCOUNTER — Other Ambulatory Visit: Payer: Self-pay | Admitting: Certified Nurse Midwife

## 2017-02-23 DIAGNOSIS — Z1231 Encounter for screening mammogram for malignant neoplasm of breast: Secondary | ICD-10-CM

## 2017-03-12 ENCOUNTER — Ambulatory Visit
Admission: RE | Admit: 2017-03-12 | Discharge: 2017-03-12 | Disposition: A | Discharge status: Home / Self Care | Payer: BLUE CROSS/BLUE SHIELD | Source: Ambulatory Visit | Attending: Certified Nurse Midwife | Admitting: Certified Nurse Midwife

## 2017-03-12 DIAGNOSIS — Z1231 Encounter for screening mammogram for malignant neoplasm of breast: Secondary | ICD-10-CM

## 2017-03-25 DIAGNOSIS — J45909 Unspecified asthma, uncomplicated: Secondary | ICD-10-CM | POA: Insufficient documentation

## 2017-04-07 ENCOUNTER — Other Ambulatory Visit: Payer: Self-pay | Admitting: Certified Nurse Midwife

## 2017-04-07 NOTE — Telephone Encounter (Signed)
Patient would like a prescription for her [facility] Phone number is [phone] and fax number is [phone].

## 2017-04-07 NOTE — Telephone Encounter (Signed)
Medication refill request: OCP Last AEX:  10/16/16 DL  Next AEX: 10/20/17  Last MMG (if hormonal medication request): 03/12/17 BIRADS 1 negative  Refill authorized: today, please advise

## 2017-04-09 MED ORDER — YAZ 3-0.02 MG PO TABS: 1.0000 | ORAL_TABLET | Freq: Every day | ORAL | 2 refills | Status: DC

## 2017-04-13 ENCOUNTER — Other Ambulatory Visit: Payer: Self-pay | Admitting: Certified Nurse Midwife

## 2017-04-13 NOTE — Telephone Encounter (Signed)
Spoke with patient to confirm OCP currently taking. Patient states she is requesting generic Yaz, drospirenone-ethinyl estradiol.   Patient states RX Yaz sent on 04/09/17, was mailed to her home and is too expensive, can not be returned. Patient states she did not request brand, has never requested Yaz brand and was not called back to confirm Rx requested prior to sending.   Advised patient per telephone encounter dated 12/02/16, patient requested RX for Yaz, brand only to [facility]. Patient states Rx was too expensive, ended up filled as generic. Advised patient refill request was from last OCP on file. Advised patient will review concerns with Nursing Supervisor and return call, patient is agreeable.   Routing to S. Orvan Seen, RN

## 2017-04-13 NOTE — Telephone Encounter (Signed)
Patient call stating she had requested a refill for "generic" Yaz to be sent to [facility]. Patient states the prescription was not sent as a generic. Patient is requesting we send a corrected script to [facility] for the generic form of this medication. Patient states to direct questions to [facility] at [phone], and that she has been working with [employer] to Triage Nurse

## 2017-04-13 NOTE — Telephone Encounter (Signed)
Patient called to check in with the nurse. She said once the prescription has been sent from [place], she has to pay the price for the brand name. This has happened already and she wants [place] to be aware of this.

## 2017-04-14 NOTE — Telephone Encounter (Signed)
Call to patient. Discussed concerns related to brand Yaz being called to mail order pharmacy. Patient expectations not met and apologized for experience; unfortunately, not able to correct this issue with pharmacy now. Can change for future refills. Call disconnected before resolution.

## 2017-04-14 NOTE — Telephone Encounter (Signed)
Called [facility]. Spoke with Lilyan Punt, agreed to particial account credit.  Call to patient. Advised of assistance provided by [facility] and agreed to resolution of situation.  Patient appreciative. See account notes.   Previous Rx for Yaz cancelled and generic RX to [facility].   Routing to provider for final review. Patient agreeable to disposition. Will close encounter.

## 2017-04-15 MED ORDER — DROSPIRENONE-ETHINYL ESTRADIOL 3-0.02 MG PO TABS: 1.0000 | ORAL_TABLET | Freq: Every day | ORAL | 2 refills | Status: DC

## 2017-10-20 ENCOUNTER — Ambulatory Visit: Payer: Self-pay | Admitting: Certified Nurse Midwife

## 2017-11-17 NOTE — Progress Notes (Signed)
47 y.o. G56P0020 Married Black or [place] American Not Hispanic or Latino female here for annual exam. She has a known fibroid uterus and endometriosis. Last ultrasound in 12/18, stable 3.5 cm complex left ovarian cyst. Left ovary adherent to left uterine side wall. H/O left ectopic pregnancy with laparotomy and left salpingectomy. She is on OCP's for cycle control. Doing very well on OCP's. No significant cramping. At her worst she can saturate a super tampon in 8 hours. Dyspareunia has improved.     Period Cycle (Days): 28 Period Duration (Days): 5-6 days Period Pattern: Regular Menstrual Flow: Moderate Menstrual Control: Tampon, Thin pad, Panty liner Menstrual Control Change Freq (Hours): changes pad/tampon every 8 hours Dysmenorrhea: (!) Mild Dysmenorrhea Symptoms: Cramping  Patient's last menstrual period was 11/11/2017 (exact date).          Sexually active: Yes.    The current method of family planning is OCP (estrogen/progesterone).    Exercising: Yes.    walking Smoker:  no  Health Maintenance: Pap:  10/16/2016 normal, 09/28/2015 normal with negative HPV History of abnormal Pap:  Yes MMG:  03/12/2017 Birads 1 negative Colonoscopy: Never TDaP:  10/21/2011 Gardasil: N/A   reports that she has never smoked. She has never used smokeless tobacco. She reports that she does not drink alcohol or use drugs. She has a Scientist, water quality.   Past Medical History:  Diagnosis Date  . Abnormal Pap smear of cervix   . Anemia   . Asthma    exercise induced  . Infertility, female     Past Surgical History:  Procedure Laterality Date  . OTHER SURGICAL HISTORY     salpingo occluded rt side, repair?  Marland Kitchen SALPINGECTOMY     left due to ectopic    Current Outpatient Medications  Medication Sig Dispense Refill  . drospirenone-ethinyl estradiol (YAZ,GIANVI,LORYNA) 3-0.02 MG tablet Take 1 tablet by mouth daily. 3 Package 2  . ibuprofen (ADVIL,MOTRIN) 800 MG tablet Take 1 tablet (800 mg total)  by mouth every 8 (eight) hours as needed. 30 tablet 1   No current facility-administered medications for this visit.     Family History  Problem Relation Age of Onset  . Cancer Maternal Aunt        colon  . Breast cancer Maternal Aunt        does not know age  . Cancer Maternal Grandmother        stomach  . Breast cancer Paternal Aunt     Review of Systems  Constitutional: Negative.   HENT: Negative.   Eyes: Negative.   Respiratory: Negative.   Cardiovascular: Negative.   Gastrointestinal: Negative.   Endocrine: Negative.   Genitourinary: Negative.   Musculoskeletal: Negative.   Skin: Negative.   Allergic/Immunologic: Negative.   Neurological: Negative.   Hematological: Negative.   Psychiatric/Behavioral: Negative.     Exam:   BP 108/64 (BP Location: Right Arm, Patient Position: Sitting, Cuff Size: Normal)   Pulse 80   Ht 5' 4.57" (1.64 m)   Wt 142 lb 6.4 oz (64.6 kg)   LMP 11/11/2017 (Exact Date)   BMI 24.02 kg/m   Weight change: @WEIGHTCHANGE @ Height:   Height: 5' 4.57" (164 cm)  Ht Readings from Last 3 Encounters:  11/23/17 5' 4.57" (1.64 m)  10/16/16 5' 3.5" (1.613 m)  05/23/16 5' 3.5" (1.613 m)    General appearance: alert, cooperative and appears stated age Head: Normocephalic, without obvious abnormality, atraumatic Neck: no adenopathy, supple, symmetrical, trachea midline and thyroid normal to  inspection and palpation Lungs: clear to auscultation bilaterally Cardiovascular: regular rate and rhythm Breasts: normal appearance, no masses or tenderness Abdomen: soft, non-tender; non distended,  no masses,  no organomegaly Extremities: extremities normal, atraumatic, no cyanosis or edema Skin: Skin color, texture, turgor normal. No rashes or lesions Lymph nodes: Cervical, supraclavicular, and axillary nodes normal. No abnormal inguinal nodes palpated Neurologic: Grossly normal   Pelvic: External genitalia:  no lesions              Urethra:  normal  appearing urethra with no masses, tenderness or lesions              Bartholins and Skenes: normal                 Vagina: normal appearing vagina with normal color and discharge, no lesions              Cervix: no cervical motion tenderness and no lesions               Bimanual Exam:  Uterus:  normal size, contour, position, consistency, mobility, non-tender, retroverted and on the left side of the uterus is a suspected pedunculated myoma, ~4-5 cm              Adnexa: no mass, fullness, tenderness               Rectovaginal: Confirms               Anus:  normal sphincter tone, no lesions  Chaperone was present for exam.  A:  Well Woman with normal exam  H/O endometriosis  H/O complex left ovarian cyst, suspected endometrioma. Over due for ultrasound  Fibroid uterus  Cycles, dysmenorrhea and dyspareunia are well controlled on OCP's  P:   No pap this year  Mammogram UTD  IFOB given  Discussed breast self exam  Discussed calcium and vit D intake  Continue OCP's  Return for Gyn ultrasound

## 2017-11-23 ENCOUNTER — Encounter: Payer: Self-pay | Admitting: Obstetrics and Gynecology

## 2017-11-23 ENCOUNTER — Other Ambulatory Visit: Payer: Self-pay

## 2017-11-23 ENCOUNTER — Ambulatory Visit: Payer: BLUE CROSS/BLUE SHIELD | Admitting: Obstetrics and Gynecology

## 2017-11-23 VITALS — BP 108/64 | HR 80 | Ht 64.57 in | Wt 142.4 lb

## 2017-11-23 DIAGNOSIS — Z0000 Encounter for general adult medical examination without abnormal findings: Secondary | ICD-10-CM

## 2017-11-23 DIAGNOSIS — Z1211 Encounter for screening for malignant neoplasm of colon: Secondary | ICD-10-CM

## 2017-11-23 DIAGNOSIS — N83202 Unspecified ovarian cyst, left side: Secondary | ICD-10-CM

## 2017-11-23 DIAGNOSIS — E559 Vitamin D deficiency, unspecified: Secondary | ICD-10-CM | POA: Diagnosis not present

## 2017-11-23 DIAGNOSIS — D259 Leiomyoma of uterus, unspecified: Secondary | ICD-10-CM

## 2017-11-23 DIAGNOSIS — Z01419 Encounter for gynecological examination (general) (routine) without abnormal findings: Secondary | ICD-10-CM

## 2017-11-23 DIAGNOSIS — Z8742 Personal history of other diseases of the female genital tract: Secondary | ICD-10-CM

## 2017-11-23 MED ORDER — DROSPIRENONE-ETHINYL ESTRADIOL 3-0.02 MG PO TABS: 1.0000 | ORAL_TABLET | Freq: Every day | ORAL | 3 refills | Status: DC

## 2017-11-23 NOTE — Patient Instructions (Signed)
EXERCISE AND DIET:  We recommended that you start or continue a regular exercise program for good health. Regular exercise means any activity that makes your heart beat faster and makes you sweat.  We recommend exercising at least 30 minutes per day at least 3 days a week, preferably 4 or 5.  We also recommend a diet low in fat and sugar.  Inactivity, poor dietary choices and obesity can cause diabetes, heart attack, stroke, and kidney damage, among others.    ALCOHOL AND SMOKING:  Women should limit their alcohol intake to no more than 7 drinks/beers/glasses of wine (combined, not each!) per week. Moderation of alcohol intake to this level decreases your risk of breast cancer and liver damage. And of course, no recreational drugs are part of a healthy lifestyle.  And absolutely no smoking or even second hand smoke. Most people know smoking can cause heart and lung diseases, but did you know it also contributes to weakening of your bones? Aging of your skin?  Yellowing of your teeth and nails?  CALCIUM AND VITAMIN D:  Adequate intake of calcium and Vitamin D are recommended.  The recommendations for exact amounts of these supplements seem to change often, but generally speaking 600 mg of calcium (either carbonate or citrate) and 800 units of Vitamin D per day seems prudent. Certain women may benefit from higher intake of Vitamin D.  If you are among these women, your doctor will have told you during your visit.    PAP SMEARS:  Pap smears, to check for cervical cancer or precancers,  have traditionally been done yearly, although recent scientific advances have shown that most women can have pap smears less often.  However, every woman still should have a physical exam from her gynecologist every year. It will include a breast check, inspection of the vulva and vagina to check for abnormal growths or skin changes, a visual exam of the cervix, and then an exam to evaluate the size and shape of the uterus and  ovaries.  And after 46 years of age, a rectal exam is indicated to check for rectal cancers. We will also provide age appropriate advice regarding health maintenance, like when you should have certain vaccines, screening for sexually transmitted diseases, bone density testing, colonoscopy, mammograms, etc.   MAMMOGRAMS:  All women over 40 years old should have a yearly mammogram. Many facilities now offer a "3D" mammogram, which may cost around $50 extra out of pocket. If possible,  we recommend you accept the option to have the 3D mammogram performed.  It both reduces the number of women who will be called back for extra views which then turn out to be normal, and it is better than the routine mammogram at detecting truly abnormal areas.    COLONOSCOPY:  Colonoscopy to screen for colon cancer is recommended for all women at age 50.  We know, you hate the idea of the prep.  We agree, BUT, having colon cancer and not knowing it is worse!!  Colon cancer so often starts as a polyp that can be seen and removed at colonscopy, which can quite literally save your life!  And if your first colonoscopy is normal and you have no family history of colon cancer, most women don't have to have it again for 10 years.  Once every ten years, you can do something that may end up saving your life, right?  We will be happy to help you get it scheduled when you are ready.    Be sure to check your insurance coverage so you understand how much it will cost.  It may be covered as a preventative service at no cost, but you should check your particular policy.      Breast Self-Awareness Breast self-awareness means being familiar with how your breasts look and feel. It involves checking your breasts regularly and reporting any changes to your health care provider. Practicing breast self-awareness is important. A change in your breasts can be a sign of a serious medical problem. Being familiar with how your breasts look and feel allows  you to find any problems early, when treatment is more likely to be successful. All women should practice breast self-awareness, including women who have had breast implants. How to do a breast self-exam One way to learn what is normal for your breasts and whether your breasts are changing is to do a breast self-exam. To do a breast self-exam: Look for Changes  1. Remove all the clothing above your waist. 2. Stand in front of a mirror in a room with good lighting. 3. Put your hands on your hips. 4. Push your hands firmly downward. 5. Compare your breasts in the mirror. Look for differences between them (asymmetry), such as: ? Differences in shape. ? Differences in size. ? Puckers, dips, and bumps in one breast and not the other. 6. Look at each breast for changes in your skin, such as: ? Redness. ? Scaly areas. 7. Look for changes in your nipples, such as: ? Discharge. ? Bleeding. ? Dimpling. ? Redness. ? A change in position. Feel for Changes  Carefully feel your breasts for lumps and changes. It is best to do this while lying on your back on the floor and again while sitting or standing in the shower or tub with soapy water on your skin. Feel each breast in the following way:  Place the arm on the side of the breast you are examining above your head.  Feel your breast with the other hand.  Start in the nipple area and make  inch (2 cm) overlapping circles to feel your breast. Use the pads of your three middle fingers to do this. Apply light pressure, then medium pressure, then firm pressure. The light pressure will allow you to feel the tissue closest to the skin. The medium pressure will allow you to feel the tissue that is a little deeper. The firm pressure will allow you to feel the tissue close to the ribs.  Continue the overlapping circles, moving downward over the breast until you feel your ribs below your breast.  Move one finger-width toward the center of the body.  Continue to use the  inch (2 cm) overlapping circles to feel your breast as you move slowly up toward your collarbone.  Continue the up and down exam using all three pressures until you reach your armpit.  Write Down What You Find  Write down what is normal for each breast and any changes that you find. Keep a written record with breast changes or normal findings for each breast. By writing this information down, you do not need to depend only on memory for size, tenderness, or location. Write down where you are in your menstrual cycle, if you are still menstruating. If you are having trouble noticing differences in your breasts, do not get discouraged. With time you will become more familiar with the variations in your breasts and more comfortable with the exam. How often should I examine my breasts? Examine   your breasts every month. If you are breastfeeding, the best time to examine your breasts is after a feeding or after using a breast pump. If you menstruate, the best time to examine your breasts is 5-7 days after your period is over. During your period, your breasts are lumpier, and it may be more difficult to notice changes. When should I see my health care provider? See your health care provider if you notice:  A change in shape or size of your breasts or nipples.  A change in the skin of your breast or nipples, such as a reddened or scaly area.  Unusual discharge from your nipples.  A lump or thick area that was not there before.  Pain in your breasts.  Anything that concerns you.  This information is not intended to replace advice given to you by your health care provider. Make sure you discuss any questions you have with your health care provider. Document Released: 02/03/2005 Document Revised: 07/12/2015 Document Reviewed: 12/24/2014 Elsevier Interactive Patient Education  2018 [employer]  

## 2017-11-24 LAB — CBC
HEMOGLOBIN: 11.1 g/dL (ref 11.1–15.9)
Hematocrit: 34.3 % (ref 34.0–46.6)
MCH: 25.4 pg — ABNORMAL LOW (ref 26.6–33.0)
MCHC: 32.4 g/dL (ref 31.5–35.7)
MCV: 79 fL (ref 79–97)
PLATELETS: 327 10*3/uL (ref 150–450)
RBC: 4.37 x10E6/uL (ref 3.77–5.28)
RDW: 12 % — ABNORMAL LOW (ref 12.3–15.4)
WBC: 5.4 10*3/uL (ref 3.4–10.8)

## 2017-11-24 LAB — COMPREHENSIVE METABOLIC PANEL
A/G RATIO: 1.9 (ref 1.2–2.2)
ALBUMIN: 4.2 g/dL (ref 3.5–5.5)
ALT: 10 IU/L (ref 0–32)
AST: 14 IU/L (ref 0–40)
Alkaline Phosphatase: 41 IU/L (ref 39–117)
BUN/Creatinine Ratio: 10 (ref 9–23)
BUN: 7 mg/dL (ref 6–24)
CHLORIDE: 106 mmol/L (ref 96–106)
CO2: 21 mmol/L (ref 20–29)
Calcium: 9.4 mg/dL (ref 8.7–10.2)
Creatinine, Ser: 0.71 mg/dL (ref 0.57–1.00)
GFR calc Af Amer: 118 mL/min/{1.73_m2} (ref >59)
GFR calc non Af Amer: 102 mL/min/{1.73_m2} (ref >59)
Globulin, Total: 2.2 g/dL (ref 1.5–4.5)
Glucose: 83 mg/dL (ref 65–99)
Potassium: 4.3 mmol/L (ref 3.5–5.2)
Sodium: 140 mmol/L (ref 134–144)
TOTAL PROTEIN: 6.4 g/dL (ref 6.0–8.5)

## 2017-11-24 LAB — LIPID PANEL
CHOLESTEROL TOTAL: 197 mg/dL (ref 100–199)
Chol/HDL Ratio: 2.7 ratio (ref 0.0–4.4)
HDL: 73 mg/dL (ref >39)
LDL Calculated: 102 mg/dL — ABNORMAL HIGH (ref 0–99)
Triglycerides: 111 mg/dL (ref 0–149)
VLDL Cholesterol Cal: 22 mg/dL (ref 5–40)

## 2017-11-24 LAB — VITAMIN D 25 HYDROXY (VIT D DEFICIENCY, FRACTURES): Vit D, 25-Hydroxy: 20.4 ng/mL — ABNORMAL LOW (ref 30.0–100.0)

## 2017-11-27 LAB — FECAL OCCULT BLOOD, IMMUNOCHEMICAL: FECAL OCCULT BLD: NEGATIVE

## 2017-12-01 ENCOUNTER — Other Ambulatory Visit: Payer: Self-pay | Admitting: Certified Nurse Midwife

## 2017-12-02 ENCOUNTER — Telehealth: Payer: Self-pay | Admitting: Obstetrics and Gynecology

## 2017-12-02 NOTE — Telephone Encounter (Signed)
Patient calling because her birth control prescription was denied.

## 2017-12-02 NOTE — Telephone Encounter (Signed)
Spoke with patient. Patient states she received an e-mail on 12/01/17 from [facility] that OCP refill request was denied.   Advised patient RX for OCP Yaz #[id number] sent to [facility] on 11/23/17. Our office did receive a refill request from [facility] on 10/15 and that was denied, because Rx previously sent.   Instructed patient to f/u with [facility] regarding RX refill and return call to office if any additional assistance is needed. Patient verbalizes understanding and is agreeable.   Encounter closed.

## 2017-12-07 NOTE — Progress Notes (Signed)
GYNECOLOGY  VISIT   HPI: 47 y.o.   Married Black or [place] American Not Hispanic or Latino  female   G2P0020 with Patient's last menstrual period was 12/08/2017.   here for consult following PUS. The patient has a h/o a fibroid uterus and a complex left ovarian cyst (likely endometrioma). She is on Yaz and her cycles are well controlled.   GYNECOLOGIC HISTORY: Patient's last menstrual period was 12/08/2017. Contraception:OCP Menopausal hormone therapy: None        OB History    Gravida  2   Para  0   Term  0   Preterm  0   AB  2   Living  0     SAB  1   TAB      Ectopic  1   Multiple      Live Births                 Patient Active Problem List   Diagnosis Date Noted  . Knee pain 10/16/2016  . Low back pain 10/16/2016    Past Medical History:  Diagnosis Date  . Abnormal Pap smear of cervix   . Anemia   . Asthma    exercise induced  . Infertility, female     Past Surgical History:  Procedure Laterality Date  . OTHER SURGICAL HISTORY     salpingo occluded rt side, repair?  Marland Kitchen SALPINGECTOMY     left due to ectopic    Current Outpatient Medications  Medication Sig Dispense Refill  . drospirenone-ethinyl estradiol (YAZ,GIANVI,LORYNA) 3-0.02 MG tablet Take 1 tablet by mouth daily. 3 Package 3  . ibuprofen (ADVIL,MOTRIN) 800 MG tablet Take 1 tablet (800 mg total) by mouth every 8 (eight) hours as needed. 30 tablet 1   No current facility-administered medications for this visit.      ALLERGIES: Latex  Family History  Problem Relation Age of Onset  . Cancer Maternal Aunt        colon  . Breast cancer Maternal Aunt        does not know age  . Cancer Maternal Grandmother        stomach  . Breast cancer Paternal Aunt     Social History   Socioeconomic History  . Marital status: Married    Spouse name: Not on file  . Number of children: Not on file  . Years of education: Not on file  . Highest education level: Not on file  Occupational  History  . Not on file  Social Needs  . Financial resource strain: Not on file  . Food insecurity:    Worry: Not on file    Inability: Not on file  . Transportation needs:    Medical: Not on file    Non-medical: Not on file  Tobacco Use  . Smoking status: Never Smoker  . Smokeless tobacco: Never Used  Substance and Sexual Activity  . Alcohol use: No  . Drug use: No  . Sexual activity: Yes    Partners: Male    Birth control/protection: Pill  Lifestyle  . Physical activity:    Days per week: Not on file    Minutes per session: Not on file  . Stress: Not on file  Relationships  . Social connections:    Talks on phone: Not on file    Gets together: Not on file    Attends religious service: Not on file    Active member of club or organization: Not on  file    Attends meetings of clubs or organizations: Not on file    Relationship status: Not on file  . Intimate partner violence:    Fear of current or ex partner: Not on file    Emotionally abused: Not on file    Physically abused: Not on file    Forced sexual activity: Not on file  Other Topics Concern  . Not on file  Social History Narrative  . Not on file    Review of Systems  Constitutional: Negative.   HENT: Negative.   Eyes: Negative.   Respiratory: Negative.   Cardiovascular: Negative.   Gastrointestinal: Negative.   Genitourinary: Negative.   Musculoskeletal: Negative.   Skin: Negative.   Neurological: Negative.   Endo/Heme/Allergies: Negative.   Psychiatric/Behavioral: Negative.     PHYSICAL EXAMINATION:    BP 124/80 (BP Location: Right Arm, Patient Position: Sitting, Cuff Size: Normal)   Pulse 72   Wt 140 lb 12.8 oz (63.9 kg)   LMP 12/08/2017   BMI 23.75 kg/m     General appearance: alert, cooperative and appears stated age  Ultrasound images were reviewed with the patient, multiple fibroids, slightly smaller. Left complex ovarian cyst also slightly smaller  ASSESSMENT Left ovarian cyst, likely  endometrioma, slightly smaller on u/s Fibroid uterus, cycles controlled on OCP's    PLAN Continue OCP's F/U for ultrasound in one year, will schedule at her next annual exam   An After Visit Summary was printed and given to the patient.

## 2017-12-08 ENCOUNTER — Ambulatory Visit (INDEPENDENT_AMBULATORY_CARE_PROVIDER_SITE_OTHER): Payer: BLUE CROSS/BLUE SHIELD | Admitting: Obstetrics and Gynecology

## 2017-12-08 ENCOUNTER — Other Ambulatory Visit: Payer: Self-pay

## 2017-12-08 ENCOUNTER — Ambulatory Visit: Payer: BLUE CROSS/BLUE SHIELD

## 2017-12-08 ENCOUNTER — Encounter: Payer: Self-pay | Admitting: Obstetrics and Gynecology

## 2017-12-08 VITALS — BP 124/80 | HR 72 | Wt 140.8 lb

## 2017-12-08 DIAGNOSIS — D259 Leiomyoma of uterus, unspecified: Secondary | ICD-10-CM

## 2017-12-08 DIAGNOSIS — N83202 Unspecified ovarian cyst, left side: Secondary | ICD-10-CM | POA: Diagnosis not present

## 2017-12-08 DIAGNOSIS — Z8742 Personal history of other diseases of the female genital tract: Secondary | ICD-10-CM | POA: Diagnosis not present

## 2017-12-09 ENCOUNTER — Telehealth: Payer: Self-pay

## 2017-12-09 DIAGNOSIS — D259 Leiomyoma of uterus, unspecified: Secondary | ICD-10-CM

## 2017-12-09 DIAGNOSIS — N83202 Unspecified ovarian cyst, left side: Secondary | ICD-10-CM

## 2017-12-09 NOTE — Telephone Encounter (Signed)
Placed in imaging recall. Order also placed for PUS in 1 year for Suzy to defer to ensure this is completed.  Cc: Lerry Liner  Routing to provider and will close encounter.

## 2017-12-09 NOTE — Telephone Encounter (Addendum)
-----   Message from Salvadore Dom, MD sent at 12/08/2017  5:25 PM EDT ----- Can you put her in some kind of recall for a f/u gyn ultrasound in a year?

## 2018-02-03 ENCOUNTER — Other Ambulatory Visit: Payer: Self-pay | Admitting: Emergency Medicine

## 2018-02-23 DIAGNOSIS — H04212 Epiphora due to excess lacrimation, left lacrimal gland: Secondary | ICD-10-CM | POA: Diagnosis not present

## 2018-03-05 ENCOUNTER — Other Ambulatory Visit: Payer: Self-pay | Admitting: Obstetrics and Gynecology

## 2018-03-05 DIAGNOSIS — Z1231 Encounter for screening mammogram for malignant neoplasm of breast: Secondary | ICD-10-CM

## 2018-03-16 ENCOUNTER — Ambulatory Visit
Admission: RE | Admit: 2018-03-16 | Discharge: 2018-03-16 | Disposition: A | Discharge status: Home / Self Care | Payer: BLUE CROSS/BLUE SHIELD | Source: Ambulatory Visit

## 2018-03-16 DIAGNOSIS — Z1231 Encounter for screening mammogram for malignant neoplasm of breast: Secondary | ICD-10-CM | POA: Diagnosis not present

## 2018-04-10 DIAGNOSIS — M5412 Radiculopathy, cervical region: Secondary | ICD-10-CM | POA: Diagnosis not present

## 2018-10-25 ENCOUNTER — Other Ambulatory Visit: Payer: Self-pay | Admitting: Obstetrics and Gynecology

## 2018-10-26 NOTE — Telephone Encounter (Signed)
Medication refill request: Yaz Last AEX:  11/23/2017 Next AEX: 11/25/2018 Last MMG (if hormonal medication request): 03/16/2018 Birads 1 negative Refill authorized: Yaz #[id number] sent to pharmacy on file to get patient to next aex

## 2018-11-08 DIAGNOSIS — M84364A Stress fracture, left fibula, initial encounter for fracture: Secondary | ICD-10-CM | POA: Diagnosis not present

## 2018-11-08 DIAGNOSIS — M19072 Primary osteoarthritis, left ankle and foot: Secondary | ICD-10-CM | POA: Diagnosis not present

## 2018-11-08 DIAGNOSIS — M7752 Other enthesopathy of left foot: Secondary | ICD-10-CM | POA: Diagnosis not present

## 2018-11-08 DIAGNOSIS — M84364D Stress fracture, left fibula, subsequent encounter for fracture with routine healing: Secondary | ICD-10-CM | POA: Diagnosis not present

## 2018-11-15 DIAGNOSIS — M84364D Stress fracture, left fibula, subsequent encounter for fracture with routine healing: Secondary | ICD-10-CM | POA: Diagnosis not present

## 2018-11-24 ENCOUNTER — Other Ambulatory Visit: Payer: Self-pay

## 2018-11-25 ENCOUNTER — Ambulatory Visit (INDEPENDENT_AMBULATORY_CARE_PROVIDER_SITE_OTHER): Payer: BC Managed Care – PPO | Admitting: Obstetrics and Gynecology

## 2018-11-25 ENCOUNTER — Encounter: Payer: Self-pay | Admitting: Obstetrics and Gynecology

## 2018-11-25 VITALS — BP 116/80 | HR 84 | Temp 97.2°F | Resp 12 | Ht 64.0 in | Wt 146.0 lb

## 2018-11-25 DIAGNOSIS — Z01419 Encounter for gynecological examination (general) (routine) without abnormal findings: Secondary | ICD-10-CM

## 2018-11-25 DIAGNOSIS — Z1211 Encounter for screening for malignant neoplasm of colon: Secondary | ICD-10-CM

## 2018-11-25 DIAGNOSIS — Z8742 Personal history of other diseases of the female genital tract: Secondary | ICD-10-CM

## 2018-11-25 DIAGNOSIS — N83202 Unspecified ovarian cyst, left side: Secondary | ICD-10-CM | POA: Diagnosis not present

## 2018-11-25 DIAGNOSIS — L659 Nonscarring hair loss, unspecified: Secondary | ICD-10-CM

## 2018-11-25 DIAGNOSIS — Z0000 Encounter for general adult medical examination without abnormal findings: Secondary | ICD-10-CM

## 2018-11-25 DIAGNOSIS — D259 Leiomyoma of uterus, unspecified: Secondary | ICD-10-CM | POA: Diagnosis not present

## 2018-11-25 DIAGNOSIS — N841 Polyp of cervix uteri: Secondary | ICD-10-CM | POA: Diagnosis not present

## 2018-11-25 DIAGNOSIS — E559 Vitamin D deficiency, unspecified: Secondary | ICD-10-CM

## 2018-11-25 DIAGNOSIS — N946 Dysmenorrhea, unspecified: Secondary | ICD-10-CM

## 2018-11-25 MED ORDER — IBUPROFEN 800 MG PO TABS: 800.0000 mg | ORAL_TABLET | Freq: Three times a day (TID) | ORAL | 1 refills | Status: DC | PRN

## 2018-11-25 MED ORDER — DROSPIRENONE-ETHINYL ESTRADIOL 3-0.02 MG PO TABS: 1.0000 | ORAL_TABLET | Freq: Every day | ORAL | 3 refills | Status: DC

## 2018-11-25 NOTE — Patient Instructions (Signed)
EXERCISE AND DIET:  We recommended that you start or continue a regular exercise program for good health. Regular exercise means any activity that makes your heart beat faster and makes you sweat.  We recommend exercising at least 30 minutes per day at least 3 days a week, preferably 4 or 5.  We also recommend a diet low in fat and sugar.  Inactivity, poor dietary choices and obesity can cause diabetes, heart attack, stroke, and kidney damage, among others.    ALCOHOL AND SMOKING:  Women should limit their alcohol intake to no more than 7 drinks/beers/glasses of wine (combined, not each!) per week. Moderation of alcohol intake to this level decreases your risk of breast cancer and liver damage. And of course, no recreational drugs are part of a healthy lifestyle.  And absolutely no smoking or even second hand smoke. Most people know smoking can cause heart and lung diseases, but did you know it also contributes to weakening of your bones? Aging of your skin?  Yellowing of your teeth and nails?  CALCIUM AND VITAMIN D:  Adequate intake of calcium and Vitamin D are recommended.  The recommendations for exact amounts of these supplements seem to change often, but generally speaking 1,000 mg of calcium (between diet and supplement) and 800 units of Vitamin D per day seems prudent. Certain women may benefit from higher intake of Vitamin D.  If you are among these women, your doctor will have told you during your visit.    PAP SMEARS:  Pap smears, to check for cervical cancer or precancers,  have traditionally been done yearly, although recent scientific advances have shown that most women can have pap smears less often.  However, every woman still should have a physical exam from her gynecologist every year. It will include a breast check, inspection of the vulva and vagina to check for abnormal growths or skin changes, a visual exam of the cervix, and then an exam to evaluate the size and shape of the uterus and  ovaries.  And after 47 years of age, a rectal exam is indicated to check for rectal cancers. We will also provide age appropriate advice regarding health maintenance, like when you should have certain vaccines, screening for sexually transmitted diseases, bone density testing, colonoscopy, mammograms, etc.   MAMMOGRAMS:  All women over 40 years old should have a yearly mammogram. Many facilities now offer a "3D" mammogram, which may cost around $50 extra out of pocket. If possible,  we recommend you accept the option to have the 3D mammogram performed.  It both reduces the number of women who will be called back for extra views which then turn out to be normal, and it is better than the routine mammogram at detecting truly abnormal areas.    COLON CANCER SCREENING: Now recommend starting at age 45. At this time colonoscopy is not covered for routine screening until 50. There are take home tests that can be done between 45-49.   COLONOSCOPY:  Colonoscopy to screen for colon cancer is recommended for all women at age 50.  We know, you hate the idea of the prep.  We agree, BUT, having colon cancer and not knowing it is worse!!  Colon cancer so often starts as a polyp that can be seen and removed at colonscopy, which can quite literally save your life!  And if your first colonoscopy is normal and you have no family history of colon cancer, most women don't have to have it again for   10 years.  Once every ten years, you can do something that may end up saving your life, right?  We will be happy to help you get it scheduled when you are ready.  Be sure to check your insurance coverage so you understand how much it will cost.  It may be covered as a preventative service at no cost, but you should check your particular policy.      Breast Self-Awareness Breast self-awareness means being familiar with how your breasts look and feel. It involves checking your breasts regularly and reporting any changes to your  health care provider. Practicing breast self-awareness is important. A change in your breasts can be a sign of a serious medical problem. Being familiar with how your breasts look and feel allows you to find any problems early, when treatment is more likely to be successful. All women should practice breast self-awareness, including women who have had breast implants. How to do a breast self-exam One way to learn what is normal for your breasts and whether your breasts are changing is to do a breast self-exam. To do a breast self-exam: Look for Changes  1. Remove all the clothing above your waist. 2. Stand in front of a mirror in a room with good lighting. 3. Put your hands on your hips. 4. Push your hands firmly downward. 5. Compare your breasts in the mirror. Look for differences between them (asymmetry), such as: ? Differences in shape. ? Differences in size. ? Puckers, dips, and bumps in one breast and not the other. 6. Look at each breast for changes in your skin, such as: ? Redness. ? Scaly areas. 7. Look for changes in your nipples, such as: ? Discharge. ? Bleeding. ? Dimpling. ? Redness. ? A change in position. Feel for Changes Carefully feel your breasts for lumps and changes. It is best to do this while lying on your back on the floor and again while sitting or standing in the shower or tub with soapy water on your skin. Feel each breast in the following way:  Place the arm on the side of the breast you are examining above your head.  Feel your breast with the other hand.  Start in the nipple area and make  inch (2 cm) overlapping circles to feel your breast. Use the pads of your three middle fingers to do this. Apply light pressure, then medium pressure, then firm pressure. The light pressure will allow you to feel the tissue closest to the skin. The medium pressure will allow you to feel the tissue that is a little deeper. The firm pressure will allow you to feel the tissue  close to the ribs.  Continue the overlapping circles, moving downward over the breast until you feel your ribs below your breast.  Move one finger-width toward the center of the body. Continue to use the  inch (2 cm) overlapping circles to feel your breast as you move slowly up toward your collarbone.  Continue the up and down exam using all three pressures until you reach your armpit.  Write Down What You Find  Write down what is normal for each breast and any changes that you find. Keep a written record with breast changes or normal findings for each breast. By writing this information down, you do not need to depend only on memory for size, tenderness, or location. Write down where you are in your menstrual cycle, if you are still menstruating. If you are having trouble noticing differences   in your breasts, do not get discouraged. With time you will become more familiar with the variations in your breasts and more comfortable with the exam. How often should I examine my breasts? Examine your breasts every month. If you are breastfeeding, the best time to examine your breasts is after a feeding or after using a breast pump. If you menstruate, the best time to examine your breasts is 5-7 days after your period is over. During your period, your breasts are lumpier, and it may be more difficult to notice changes. When should I see my health care provider? See your health care provider if you notice:  A change in shape or size of your breasts or nipples.  A change in the skin of your breast or nipples, such as a reddened or scaly area.  Unusual discharge from your nipples.  A lump or thick area that was not there before.  Pain in your breasts.  Anything that concerns you.  

## 2018-11-25 NOTE — Progress Notes (Signed)
48 y.o. G52P0020 Married Black or [place] American Not Hispanic or Latino female here for annual exam.   She fractured her ankle a few months ago. With covid she was having increased stress, hair loss. Husband was out of work for a while.  The patient has a fibroid uterus and ovarian cyst (likely endometrioma), due for yearly ultrasound. On OCP's for cycle control. The cramps are getting worse again, vary in intensity. She can miss work because of it. Normally she can control the cramps if she takes the Ibuprofen prior to the cramps getting better.  Period Cycle (Days): 28 Period Duration (Days): 6 Period Pattern: Regular Menstrual Flow: Heavy Menstrual Control: Maxi pad Menstrual Control Change Freq (Hours): 2-3 Dysmenorrhea: (!) Severe Dysmenorrhea Symptoms: Cramping  Patient's last menstrual period was 11/15/2018.          Sexually active: Yes.    The current method of family planning is OCP (estrogen/progesterone).    Exercising: No.  The patient does not participate in regular exercise at present. Smoker:  no  Health Maintenance: Pap:  10/16/16 Negative, 09/28/15: negative, negative HPV History of abnormal Pap:  yes MMG:  03/16/18 BIRADS 1 negative/density c BMD:   never Colonoscopy: never TDaP:  10/21/11 Gardasil: n/a   reports that she has never smoked. She has never used smokeless tobacco. She reports that she does not drink alcohol or use drugs. She has a Scientist, water quality.  Past Medical History:  Diagnosis Date  . Abnormal Pap smear of cervix   . Anemia   . Asthma    exercise induced  . Infertility, female     Past Surgical History:  Procedure Laterality Date  . OTHER SURGICAL HISTORY     salpingo occluded rt side, repair?  Marland Kitchen SALPINGECTOMY     left due to ectopic    Current Outpatient Medications  Medication Sig Dispense Refill  . drospirenone-ethinyl estradiol (YAZ) 3-0.02 MG tablet TAKE 1 TABLET DAILY 3 Package 0  . ibuprofen (ADVIL,MOTRIN) 800 MG tablet Take  1 tablet (800 mg total) by mouth every 8 (eight) hours as needed. 30 tablet 1  . meloxicam (MOBIC) 7.5 MG tablet TAKE 1 TABLET BY MOUTH ONCE DAILY FOR 30 DAYS     No current facility-administered medications for this visit.     Family History  Problem Relation Age of Onset  . Cancer Maternal Aunt        colon  . Breast cancer Maternal Aunt        does not know age  . Cancer Maternal Grandmother        stomach  . Breast cancer Paternal Aunt     Review of Systems  Constitutional: Negative.   HENT: Negative.   Eyes: Negative.   Respiratory: Negative.   Cardiovascular: Negative.   Gastrointestinal: Negative.   Endocrine: Negative.   Genitourinary: Negative.   Musculoskeletal: Negative.   Skin: Negative.   Allergic/Immunologic: Negative.   Neurological: Negative.   Hematological: Negative.   Psychiatric/Behavioral: Negative.     Exam:   BP 116/80 (BP Location: Right Arm, Patient Position: Sitting, Cuff Size: Normal)   Pulse 84   Temp (!) 97.2 F (36.2 C) (Temporal)   Resp 12   Ht 5\' 4"  (1.626 m)   Wt 146 lb (66.2 kg)   LMP 11/15/2018   BMI 25.06 kg/m   Weight change: @WEIGHTCHANGE @ Height:   Height: 5\' 4"  (162.6 cm)  Ht Readings from Last 3 Encounters:  11/25/18 5\' 4"  (1.626 m)  11/23/17  5' 4.57" (1.64 m)  10/16/16 5' 3.5" (1.613 m)    General appearance: alert, cooperative and appears stated age Head: Normocephalic, without obvious abnormality, atraumatic Neck: no adenopathy, supple, symmetrical, trachea midline and thyroid normal to inspection and palpation Lungs: clear to auscultation bilaterally Cardiovascular: regular rate and rhythm Breasts: normal appearance, no masses or tenderness Abdomen: soft, non-tender; non distended,  no masses,  no organomegaly Extremities: extremities normal, atraumatic, no cyanosis or edema Skin: Skin color, texture, turgor normal. No rashes or lesions Lymph nodes: Cervical, supraclavicular, and axillary nodes normal. No  abnormal inguinal nodes palpated Neurologic: Grossly normal   Pelvic: External genitalia:  no lesions              Urethra:  normal appearing urethra with no masses, tenderness or lesions              Bartholins and Skenes: normal                 Vagina: normal appearing vagina with normal color and discharge, no lesions              Cervix: cervical polyp, removed with ringed forceps               Bimanual Exam:  Uterus:  retroverted, slighlty enlarged, fibroid felt coming off of left lateral uterus, ~5 cm              Adnexa: no mass, fullness, tenderness               Rectovaginal: Confirms               Anus:  normal sphincter tone, no lesions  Chaperone was present for exam.  A:  Well Woman with normal exam  Cervical polyp  Fibroid uterus  Ovarian cyst  P:   No pap this year  Mammogram UTD  IFOB given  Discussed breast self exam  Discussed calcium and vit D intake  Return for GYN ultrasound  Cervical polyp removed

## 2018-11-26 LAB — COMPREHENSIVE METABOLIC PANEL
ALT: 14 IU/L (ref 0–32)
AST: 22 IU/L (ref 0–40)
Albumin/Globulin Ratio: 1.8 (ref 1.2–2.2)
Albumin: 4.4 g/dL (ref 3.8–4.8)
Alkaline Phosphatase: 45 IU/L (ref 39–117)
BUN/Creatinine Ratio: 12 (ref 9–23)
BUN: 10 mg/dL (ref 6–24)
Bilirubin Total: 0.4 mg/dL (ref 0.0–1.2)
CO2: 21 mmol/L (ref 20–29)
Calcium: 9.6 mg/dL (ref 8.7–10.2)
Chloride: 105 mmol/L (ref 96–106)
Creatinine, Ser: 0.86 mg/dL (ref 0.57–1.00)
GFR calc Af Amer: 93 mL/min/{1.73_m2} (ref >59)
GFR calc non Af Amer: 81 mL/min/{1.73_m2} (ref >59)
Globulin, Total: 2.5 g/dL (ref 1.5–4.5)
Glucose: 83 mg/dL (ref 65–99)
Potassium: 4.3 mmol/L (ref 3.5–5.2)
Sodium: 142 mmol/L (ref 134–144)
Total Protein: 6.9 g/dL (ref 6.0–8.5)

## 2018-11-26 LAB — LIPID PANEL
Chol/HDL Ratio: 2.9 ratio (ref 0.0–4.4)
Cholesterol, Total: 201 mg/dL — ABNORMAL HIGH (ref 100–199)
HDL: 70 mg/dL (ref >39)
LDL Chol Calc (NIH): 113 mg/dL — ABNORMAL HIGH (ref 0–99)
Triglycerides: 104 mg/dL (ref 0–149)
VLDL Cholesterol Cal: 18 mg/dL (ref 5–40)

## 2018-11-26 LAB — VITAMIN D 25 HYDROXY (VIT D DEFICIENCY, FRACTURES): Vit D, 25-Hydroxy: 34.6 ng/mL (ref 30.0–100.0)

## 2018-11-26 LAB — CBC
Hematocrit: 37.5 % (ref 34.0–46.6)
Hemoglobin: 11.9 g/dL (ref 11.1–15.9)
MCH: 26.2 pg — ABNORMAL LOW (ref 26.6–33.0)
MCHC: 31.7 g/dL (ref 31.5–35.7)
MCV: 82 fL (ref 79–97)
Platelets: 376 10*3/uL (ref 150–450)
RBC: 4.55 x10E6/uL (ref 3.77–5.28)
RDW: 12 % (ref 11.7–15.4)
WBC: 5.5 10*3/uL (ref 3.4–10.8)

## 2018-11-26 LAB — TSH: TSH: 0.583 u[IU]/mL (ref 0.450–4.500)

## 2018-11-29 DIAGNOSIS — M84364D Stress fracture, left fibula, subsequent encounter for fracture with routine healing: Secondary | ICD-10-CM | POA: Diagnosis not present

## 2018-11-29 NOTE — Addendum Note (Signed)
Addended by: Dorothy Spark on: 11/29/2018 04:29 PM   Modules accepted: Orders

## 2018-11-30 ENCOUNTER — Other Ambulatory Visit: Payer: Self-pay | Admitting: Obstetrics and Gynecology

## 2018-11-30 DIAGNOSIS — N841 Polyp of cervix uteri: Secondary | ICD-10-CM | POA: Diagnosis not present

## 2018-12-02 NOTE — Progress Notes (Signed)
GYNECOLOGY  VISIT   HPI: 48 y.o.   Married Black or [place] American Not Hispanic or Latino  female   G2P0020 with Patient's last menstrual period was 11/15/2018.   here for consult after PUS   The patient is getting yearly ultrasounds for f/u of a left ovarian cyst (likely endometrioma), she also has a fibroid uterus. At her last ultrasound her complex left ovarian cyst was 2.2 x 1.2 cm.  On OCP's for cycle control.   GYNECOLOGIC HISTORY: Patient's last menstrual period was 11/15/2018. Contraception: OCP Menopausal hormone therapy: None       OB History    Gravida  2   Para  0   Term  0   Preterm  0   AB  2   Living  0     SAB  1   TAB      Ectopic  1   Multiple      Live Births                 Patient Active Problem List   Diagnosis Date Noted  . Knee pain 10/16/2016  . Low back pain 10/16/2016    Past Medical History:  Diagnosis Date  . Abnormal Pap smear of cervix   . Anemia   . Asthma    exercise induced  . Infertility, female     Past Surgical History:  Procedure Laterality Date  . OTHER SURGICAL HISTORY     salpingo occluded rt side, repair?  Marland Kitchen SALPINGECTOMY     left due to ectopic    Current Outpatient Medications  Medication Sig Dispense Refill  . drospirenone-ethinyl estradiol (YAZ) 3-0.02 MG tablet Take 1 tablet by mouth daily. 3 Package 3  . ibuprofen (ADVIL) 800 MG tablet Take 1 tablet (800 mg total) by mouth every 8 (eight) hours as needed. 30 tablet 1  . meloxicam (MOBIC) 7.5 MG tablet TAKE 1 TABLET BY MOUTH ONCE DAILY FOR 30 DAYS     No current facility-administered medications for this visit.      ALLERGIES: Latex  Family History  Problem Relation Age of Onset  . Cancer Maternal Aunt        colon  . Breast cancer Maternal Aunt        does not know age  . Cancer Maternal Grandmother        stomach  . Breast cancer Paternal Aunt     Social History   Socioeconomic History  . Marital status: Married    Spouse  name: Not on file  . Number of children: Not on file  . Years of education: Not on file  . Highest education level: Not on file  Occupational History  . Not on file  Social Needs  . Financial resource strain: Not on file  . Food insecurity    Worry: Not on file    Inability: Not on file  . Transportation needs    Medical: Not on file    Non-medical: Not on file  Tobacco Use  . Smoking status: Never Smoker  . Smokeless tobacco: Never Used  Substance and Sexual Activity  . Alcohol use: No  . Drug use: No  . Sexual activity: Yes    Partners: Male    Birth control/protection: Pill  Lifestyle  . Physical activity    Days per week: Not on file    Minutes per session: Not on file  . Stress: Not on file  Relationships  . Social connections  Talks on phone: Not on file    Gets together: Not on file    Attends religious service: Not on file    Active member of club or organization: Not on file    Attends meetings of clubs or organizations: Not on file    Relationship status: Not on file  . Intimate partner violence    Fear of current or ex partner: Not on file    Emotionally abused: Not on file    Physically abused: Not on file    Forced sexual activity: Not on file  Other Topics Concern  . Not on file  Social History Narrative  . Not on file    Review of Systems  Constitutional: Negative.   HENT: Negative.   Eyes: Negative.   Respiratory: Negative.   Cardiovascular: Negative.   Gastrointestinal: Negative.   Genitourinary: Negative.   Musculoskeletal: Negative.   Skin: Negative.   Neurological: Negative.   Endo/Heme/Allergies: Negative.   Psychiatric/Behavioral: Negative.     PHYSICAL EXAMINATION:    BP 118/78 (BP Location: Right Arm, Patient Position: Sitting, Cuff Size: Normal)   Pulse 76   Temp (!) 97.2 F (36.2 C) (Skin)   LMP 11/15/2018     General appearance: alert, cooperative and appears stated age   Ultrasound images reviewed with the patient.  Her uterus and fibroids are slightly bigger. Her complex left ovarian cyst is slightly smaller at 2.2 x 0.6 cm (down from 2.2 x 1.2 cm)  ASSESSMENT Fibroid uterus, slightly larger, overall symptoms are tolerable Small complex left ovarian cyst, likely endometrioma    PLAN Continue OCPs F/u ultrasound in one year.    An After Visit Summary was printed and given to the patient.

## 2018-12-03 ENCOUNTER — Other Ambulatory Visit: Payer: Self-pay

## 2018-12-07 ENCOUNTER — Ambulatory Visit (INDEPENDENT_AMBULATORY_CARE_PROVIDER_SITE_OTHER): Payer: BC Managed Care – PPO

## 2018-12-07 ENCOUNTER — Ambulatory Visit: Payer: BC Managed Care – PPO | Admitting: Obstetrics and Gynecology

## 2018-12-07 ENCOUNTER — Encounter: Payer: Self-pay | Admitting: Obstetrics and Gynecology

## 2018-12-07 ENCOUNTER — Other Ambulatory Visit: Payer: Self-pay

## 2018-12-07 VITALS — BP 118/78 | HR 76 | Temp 97.2°F

## 2018-12-07 DIAGNOSIS — D259 Leiomyoma of uterus, unspecified: Secondary | ICD-10-CM

## 2018-12-07 DIAGNOSIS — N83202 Unspecified ovarian cyst, left side: Secondary | ICD-10-CM

## 2018-12-09 DIAGNOSIS — M84364D Stress fracture, left fibula, subsequent encounter for fracture with routine healing: Secondary | ICD-10-CM | POA: Diagnosis not present

## 2019-01-03 ENCOUNTER — Other Ambulatory Visit: Payer: Self-pay | Admitting: Obstetrics and Gynecology

## 2019-01-05 LAB — FECAL OCCULT BLOOD, IMMUNOCHEMICAL: Fecal Occult Bld: NEGATIVE

## 2019-01-05 LAB — SPECIMEN STATUS REPORT

## 2019-01-17 ENCOUNTER — Other Ambulatory Visit: Payer: Self-pay | Admitting: Obstetrics and Gynecology

## 2019-01-24 ENCOUNTER — Other Ambulatory Visit: Payer: Self-pay | Admitting: Obstetrics and Gynecology

## 2019-01-24 MED ORDER — DROSPIRENONE-ETHINYL ESTRADIOL 3-0.02 MG PO TABS: 1.0000 | ORAL_TABLET | Freq: Every day | ORAL | 3 refills | Status: DC

## 2019-01-24 NOTE — Telephone Encounter (Signed)
Medication refill request: yaz Last AEX:  11-25-2018 JJ  Next AEX: not currently scheduled  Last MMG (if hormonal medication request): 03-16-2018 density C/BIRADS 1 negative  Refill authorized: Today, please advise.   Call to patient. Patient requesting refill of yaz to [facility]. Medication pended for #3, 3RF. Please refill if appropriate.   Patient also states she had discussed possible surgery for fibroids with Dr. Talbert Nan at last appointment. Interested in proceeding. Consult to discuss surgery options scheduled for 02-14-2019 at 0800. Patient agreeable to date and time of appointment.

## 2019-01-24 NOTE — Telephone Encounter (Signed)
Patient is calling regarding refill being sent to [facility]. Patient is needed it transferred to [place]. Patient stated that there is a form that needs to be filled out for [facility] and this cannot be done over the phone.

## 2019-02-07 NOTE — Progress Notes (Signed)
Virtual Visit via Video Note  I connected with Angela Glover on 02/14/19 at  8:00 AM EST by a video enabled telemedicine application and verified that I am speaking with the correct person using two identifiers.  Location: Patient: Home Provider: Office at [facility]   I discussed the limitations of evaluation and management by telemedicine and the availability of in person appointments. The patient expressed understanding and agreed to proceed.    GYNECOLOGY  VISIT   HPI: 48 y.o.   Married Black or [place] American Not Hispanic or Latino  female   [phone] with No LMP recorded.   Virtual visit to discuss hysterectomy. She has a known fibroid uterus and endometriosis. She is being followed with serial ultrasounds for a stable, 2.2 cm, complex left ovarian cyst (suspected endometrioma). It appears adherent to the left uterine side wall on ultrasound.  Her last ultrasound was in 10/20 and showed a fibroid uterus, 7 fibroids were measured ranging 1.1 cm to 5.4 cm. She has a h/o a left ectopic pregnancy with laparotomy and left salpingectomy.   She has been on OCP's for cycle control, but her cramps have gotten progressively worse. She has been missing work secondary to the severity of her cramps. She would like definitive treatment.   GYNECOLOGIC HISTORY: No LMP recorded. Contraception:OCP's Menopausal hormone therapy: none        OB History    Gravida  2   Para  0   Term  0   Preterm  0   AB  2   Living  0     SAB  1   TAB      Ectopic  1   Multiple      Live Births                 Patient Active Problem List   Diagnosis Date Noted  . Knee pain 10/16/2016  . Low back pain 10/16/2016    Past Medical History:  Diagnosis Date  . Abnormal Pap smear of cervix   . Anemia   . Asthma    exercise induced  . Infertility, female     Past Surgical History:  Procedure Laterality Date  . OTHER SURGICAL HISTORY     salpingo  occluded rt side, repair?  Marland Kitchen SALPINGECTOMY     left due to ectopic  one tube removed with ectopic, some scar tissue was removed.   Current Outpatient Medications  Medication Sig Dispense Refill  . drospirenone-ethinyl estradiol (YAZ) 3-0.02 MG tablet Take 1 tablet by mouth daily. 3 Package 3  . ibuprofen (ADVIL) 800 MG tablet Take 1 tablet (800 mg total) by mouth every 8 (eight) hours as needed. 30 tablet 1  . meloxicam (MOBIC) 7.5 MG tablet TAKE 1 TABLET BY MOUTH ONCE DAILY FOR 30 DAYS     No current facility-administered medications for this visit.     ALLERGIES: Latex  Family History  Problem Relation Age of Onset  . Cancer Maternal Aunt        colon  . Breast cancer Maternal Aunt        does not know age  . Cancer Maternal Grandmother        stomach  . Breast cancer Paternal Aunt     Social History   Socioeconomic History  . Marital status: Married    Spouse name: Not on file  . Number of children: Not on file  . Years of education: Not on file  .  Highest education level: Not on file  Occupational History  . Not on file  Tobacco Use  . Smoking status: Never Smoker  . Smokeless tobacco: Never Used  Substance and Sexual Activity  . Alcohol use: No  . Drug use: No  . Sexual activity: Yes    Partners: Male    Birth control/protection: Pill  Other Topics Concern  . Not on file  Social History Narrative  . Not on file   Social Determinants of Health   Financial Resource Strain:   . Difficulty of Paying Living Expenses: Not on file  Food Insecurity:   . Worried About Charity fundraiser in the Last Year: Not on file  . Ran Out of Food in the Last Year: Not on file  Transportation Needs:   . Lack of Transportation (Medical): Not on file  . Lack of Transportation (Non-Medical): Not on file  Physical Activity:   . Days of Exercise per Week: Not on file  . Minutes of Exercise per Session: Not on file  Stress:   . Feeling of Stress : Not on file  Social  Connections:   . Frequency of Communication with Friends and Family: Not on file  . Frequency of Social Gatherings with Friends and Family: Not on file  . Attends Religious Services: Not on file  . Active Member of Clubs or Organizations: Not on file  . Attends Archivist Meetings: Not on file  . Marital Status: Not on file  Intimate Partner Violence:   . Fear of Current or Ex-Partner: Not on file  . Emotionally Abused: Not on file  . Physically Abused: Not on file  . Sexually Abused: Not on file    ROS  PHYSICAL EXAMINATION:    There were no vitals taken for this visit.    General appearance: alert, cooperative and appears stated age  ASSESSMENT Complex left ovarian cyst, suspected endometrioma H/O endometriosis Fibroid uterus Severe dysmenorrhea H/O laparotomy    PLAN Discussed total laparoscopic hysterectomy, bilateral salpingectomies, left oophorectomy vs cystectomy and cystoscopy. Reviewed the risks of the procedure, including infection, bleeding, damage to bowel/badder/vessels/ureters. Discussed increased risk of scar tissue and injury with her h/o endometriosis and prior laparotomy. Discussed post operative recovery and risk of cuff dehiscence. All of her questions were answered    I provided ~10 minutes of non-face-to-face time during this encounter.   Salvadore Dom, MD

## 2019-02-14 ENCOUNTER — Telehealth (INDEPENDENT_AMBULATORY_CARE_PROVIDER_SITE_OTHER): Payer: BC Managed Care – PPO | Admitting: Obstetrics and Gynecology

## 2019-02-14 ENCOUNTER — Other Ambulatory Visit: Payer: Self-pay

## 2019-02-14 DIAGNOSIS — N946 Dysmenorrhea, unspecified: Secondary | ICD-10-CM | POA: Diagnosis not present

## 2019-02-14 DIAGNOSIS — D259 Leiomyoma of uterus, unspecified: Secondary | ICD-10-CM | POA: Diagnosis not present

## 2019-02-14 DIAGNOSIS — N83202 Unspecified ovarian cyst, left side: Secondary | ICD-10-CM

## 2019-02-14 DIAGNOSIS — Z8742 Personal history of other diseases of the female genital tract: Secondary | ICD-10-CM

## 2019-02-15 ENCOUNTER — Encounter: Payer: Self-pay | Admitting: Obstetrics and Gynecology

## 2019-02-22 ENCOUNTER — Telehealth: Payer: Self-pay | Admitting: Obstetrics and Gynecology

## 2019-02-22 NOTE — Telephone Encounter (Signed)
Spoke with patient. Patient would like to proceed with surgery on February 8th. Advised will tentatively put her down for February 8th, 2021 and will confirm scheduling with [facility] and return call once this is confirmed to review surgery instructions. Patient verbalizes understanding.

## 2019-02-22 NOTE — Telephone Encounter (Signed)
Returned call to patient to review benefit for surgery. Left voicemail message requesting a return call.   cc: Reesa Chew, RN        Lamont Snowball, RN

## 2019-02-22 NOTE — Telephone Encounter (Signed)
Patient is calling to schedule surgery.

## 2019-02-22 NOTE — Telephone Encounter (Signed)
Patient has returned call. She has Confirmed and is ready to move forward with scheduling surgery. Patient is aware she will receive a return call from our Nurse Supervisor for scheduling.   Forwarding to Reesa Chew, RN for scheduling.   cc: Lamont Snowball, RN

## 2019-02-22 NOTE — Telephone Encounter (Signed)
Patient returned call. Reviewed benefit for surgery. Patient acknowledges understanding of information presented. Patient is aware this is the professional benefit. Patient aware once surgery is scheduled, the hospital will call with separate benefit information. Patient is aware of the surgery cancellation policy. Patient desires to move forward with scheduling in mid-February, but will call today to confirm.   cc: Reesa Chew, RN        Lamont Snowball, RN

## 2019-02-23 NOTE — Telephone Encounter (Signed)
Spoke with patient regarding surgery scheduling. Surgery scheduled for 03/28/2019 at 7:15 am at [facility]. Patient is agreeable. Pre op scheduled for 03/08/2019 at 4 pm. 1 week post op scheduled for 04/04/2019 at 2:30 pm. 4 week post op scheduled for 04/25/2019 at 1:20 pm. COVID 19 test scheduled for 03/24/2019 at 3:10 pm at the [facility]. Patient is agreeable to all appointment dates and times. Surgery instructions reviewed and mailed to patient's home address on file.  Routing to provider and will close encounter.

## 2019-03-08 ENCOUNTER — Ambulatory Visit: Payer: BC Managed Care – PPO | Admitting: Obstetrics and Gynecology

## 2019-03-08 ENCOUNTER — Encounter: Payer: Self-pay | Admitting: Obstetrics and Gynecology

## 2019-03-08 ENCOUNTER — Other Ambulatory Visit: Payer: Self-pay

## 2019-03-08 VITALS — BP 110/60 | HR 68 | Temp 98.1°F | Ht 64.0 in | Wt 139.4 lb

## 2019-03-08 DIAGNOSIS — N946 Dysmenorrhea, unspecified: Secondary | ICD-10-CM

## 2019-03-08 DIAGNOSIS — Z8742 Personal history of other diseases of the female genital tract: Secondary | ICD-10-CM

## 2019-03-08 DIAGNOSIS — D259 Leiomyoma of uterus, unspecified: Secondary | ICD-10-CM

## 2019-03-08 DIAGNOSIS — N83202 Unspecified ovarian cyst, left side: Secondary | ICD-10-CM

## 2019-03-08 NOTE — Patient Instructions (Signed)
Hysterectomy Information  A hysterectomy is a surgery in which the uterus is removed. The fallopian tubes and ovaries may be removed (bilateral salpingo-oophorectomy) as well. This procedure may be done to treat various medical problems. After the procedure, a woman will no longer have menstrual periods nor will she be able to become pregnant (sterile). What are the reasons for a hysterectomy? There are many reasons why a woman might have this procedure. They include:  Persistent, abnormal vaginal bleeding.  Long-term (chronic) pelvic pain or infection.  Endometriosis. This is when the lining of the uterus (endometrium) starts to grow outside the uterus.  Adenomyosis. This is when the endometrium starts to grow in the muscle of the uterus.  Pelvic organ prolapse. This is a condition in which the uterus falls down into the vagina.  Noncancerous growths in the uterus (uterine fibroids) that cause symptoms.  The presence of precancerous cells.  Cervical or uterine cancer. What are the different types of hysterectomy? There are three different types of hysterectomy:  Supracervical hysterectomy. In this type, the top part of the uterus is removed, but not the cervix.  Total hysterectomy. In this type, the uterus and cervix are removed.  Radical hysterectomy. In this type, the uterus, the cervix, and the tissue that holds the uterus in place (parametrium) are removed. What are the different ways a hysterectomy can be performed? There are many different ways a hysterectomy can be performed, including:  Abdominal hysterectomy. In this type, an incision is made in the abdomen. The uterus is removed through this incision.  Vaginal hysterectomy. In this type, an incision is made in the vagina. The uterus is removed through this incision. There are no abdominal incisions.  Conventional laparoscopic hysterectomy. In this type, three or four small incisions are made in the abdomen. A thin,  lighted tube with a camera (laparoscope) is inserted into one of the incisions. Other tools are put through the other incisions. The uterus is cut into small pieces. The small pieces are removed through the incisions or through the vagina.  Laparoscopically assisted vaginal hysterectomy (LAVH). In this type, three or four small incisions are made in the abdomen. Part of the surgery is performed laparoscopically and the other part is done vaginally. The uterus is removed through the vagina.  Robot-assisted laparoscopic hysterectomy. In this type, a laparoscope and other tools are inserted into three or four small incisions in the abdomen. A computer-controlled device is used to give the surgeon a 3D image and to help control the surgical instruments. This allows for more precise movements of surgical instruments. The uterus is cut into small pieces and removed through the incisions or removed through the vagina. Discuss the options with your health care provider to determine which type is the right one for you. What are the risks? Generally, this is a safe procedure. However, problems may occur, including:  Bleeding and risk of blood transfusion. Tell your health care provider if you do not want to receive any blood products.  Blood clots in the legs or lung.  Infection.  Damage to other structures or organs.  Allergic reactions to medicines.  Changing to an abdominal hysterectomy from one of the other techniques. What to expect after a hysterectomy  You will be given pain medicine.  You may need to stay in the hospital for 1- 2 days to recover, depending on the type of hysterectomy you had.  Follow your health care provider's instructions about exercise, driving, and general activities. Ask your   health care provider what activities are safe for you.  You will need to have someone with you for the first 3-5 days after you go home.  You will need to follow up with your surgeon in 2-4  weeks after surgery to evaluate your progress.  If the ovaries are removed, you will have early menopause symptoms such as hot flashes, night sweats, and insomnia.  If you had a hysterectomy for a problem that was not cancer or not a condition that could lead to cancer, then you no longer need Pap tests. However, even if you no longer need a Pap test, a regular pelvic exam is a good idea to make sure no other problems are developing. Questions to ask your health care provider  Is a hysterectomy medically necessary? Do I have other treatment options for my condition?  What are my options for hysterectomy procedure?  What organs and tissues need to be removed?  What are the risks?  What are the benefits?  How long will I need to stay in the hospital after the procedure?  How long will I need to recover at home?  What symptoms can I expect after the procedure? Summary  A hysterectomy is a surgery in which the uterus is removed. The fallopian tubes and ovaries may be removed (bilateral salpingo-oophorectomy) as well.  This procedure may be done to treat various medical problems. After the procedure, a woman will no longer have menstrual periods nor will she be able to become pregnant.  Discuss the options with your health care provider to determine which type of hysterectomy is the right one for you. This information is not intended to replace advice given to you by your health care provider. Make sure you discuss any questions you have with your health care provider. Document Revised: 01/16/2017 Document Reviewed: 03/12/2016 Elsevier Patient Education  2020 [employer]vier Inc[employer]  

## 2019-03-08 NOTE — Progress Notes (Signed)
GYNECOLOGY  VISIT   HPI: 49 y.o.   Married Black or [place] American Not Hispanic or Latino  female   G2P0020 with last menstrual period 02/20/19   here for a pre-op for hysterectomy. She has a known fibroid uterus and h/o endometriosis. She has been followed with serial ultrasounds for a stable 2.2 cm, complex left ovarian cyst (suspected endometrioma, adherent to left uterine side wall on ultrasound). Ultrasound in 10/20 7 fibroids were noted, ranging in size from 1.1 cm to 5.4 cm.  She has a h/o a left ectopic pregnancy with laparotomy and left salpingectomy.  She has been on OCP's for cycle control, but her cramps have gotten progressively worse to the point as being severe.  She desires definitive treatment.   Last pap 10/16/16 negative, prior pap on 09/28/15 negative with negative hpv.   GYNECOLOGIC HISTORY: Patient's last menstrual period was 02/20/2019. Contraception: OCP's Menopausal hormone therapy: none        OB History    Gravida  2   Para  0   Term  0   Preterm  0   AB  2   Living  0     SAB  1   TAB      Ectopic  1   Multiple      Live Births                 Patient Active Problem List   Diagnosis Date Noted  . Knee pain 10/16/2016  . Low back pain 10/16/2016    Past Medical History:  Diagnosis Date  . Abnormal Pap smear of cervix   . Anemia   . Asthma    exercise induced  . Infertility, female     Past Surgical History:  Procedure Laterality Date  . SALPINGECTOMY     left due to ectopic, laparotomy. Endometriosis noted.     Current Outpatient Medications  Medication Sig Dispense Refill  . drospirenone-ethinyl estradiol (YAZ) 3-0.02 MG tablet Take 1 tablet by mouth daily. 3 Package 3  . ibuprofen (ADVIL) 800 MG tablet Take 1 tablet (800 mg total) by mouth every 8 (eight) hours as needed. 30 tablet 1   No current facility-administered medications for this visit.     ALLERGIES: Latex  Family History  Problem Relation Age of Onset   . Cancer Maternal Aunt        colon  . Breast cancer Maternal Aunt        does not know age  . Cancer Maternal Grandmother        stomach  . Breast cancer Paternal Aunt     Social History   Socioeconomic History  . Marital status: Married    Spouse name: Not on file  . Number of children: Not on file  . Years of education: Not on file  . Highest education level: Not on file  Occupational History  . Not on file  Tobacco Use  . Smoking status: Never Smoker  . Smokeless tobacco: Never Used  Substance and Sexual Activity  . Alcohol use: No  . Drug use: No  . Sexual activity: Yes    Partners: Male    Birth control/protection: Pill  Other Topics Concern  . Not on file  Social History Narrative  . Not on file   Social Determinants of Health   Financial Resource Strain:   . Difficulty of Paying Living Expenses: Not on file  Food Insecurity:   . Worried About [employer] of  Food in the Last Year: Not on file  . Ran Out of Food in the Last Year: Not on file  Transportation Needs:   . Lack of Transportation (Medical): Not on file  . Lack of Transportation (Non-Medical): Not on file  Physical Activity:   . Days of Exercise per Week: Not on file  . Minutes of Exercise per Session: Not on file  Stress:   . Feeling of Stress : Not on file  Social Connections:   . Frequency of Communication with Friends and Family: Not on file  . Frequency of Social Gatherings with Friends and Family: Not on file  . Attends Religious Services: Not on file  . Active Member of Clubs or Organizations: Not on file  . Attends Archivist Meetings: Not on file  . Marital Status: Not on file  Intimate Partner Violence:   . Fear of Current or Ex-Partner: Not on file  . Emotionally Abused: Not on file  . Physically Abused: Not on file  . Sexually Abused: Not on file    Review of Systems  All other systems reviewed and are negative.   PHYSICAL EXAMINATION:    BP 110/60   Pulse 68    Temp 98.1 F (36.7 C)   Ht 5\' 4"  (1.626 m)   Wt 139 lb 6.4 oz (63.2 kg)   LMP 02/20/2019   SpO2 98%   BMI 23.93 kg/m     General appearance: alert, cooperative and appears stated age Neck: no adenopathy, supple, symmetrical, trachea midline and thyroid normal to inspection and palpation Heart: regular rate and rhythm Lungs: CTAB Abdomen: soft, non-tender; bowel sounds normal; no masses,  no organomegaly Extremities: normal, atraumatic, no cyanosis Skin: normal color, texture and turgor, no rashes or lesions Lymph: normal cervical supraclavicular and inguinal nodes Neurologic: grossly normal   ASSESSMENT Complex left ovarian cyst, suspected endometrioma H/O endometriosis Fibroid uterus Severe dysmenorrhea H/O laparotomy    PLAN Discussed total laparoscopic hysterectomy, right salpingectomy. Left oophorectomy vs ovarian cystectomy and cystoscopy. Reviewed the risks of the procedure, including infection, bleeding, damage to bowel/badder/vessels/ureters. Discussed increased risk of scar tissue and injury with her h/o endometriosis and prior laparotomy. Discussed the possible need for laparotomy. Discussed post operative recovery and risk of cuff dehiscence. All of her questions were answered    An After Visit Summary was printed and given to the patient.

## 2019-03-08 NOTE — H&P (View-Only) (Signed)
GYNECOLOGY  VISIT   HPI: 49 y.o.   Married Black or [place] American Not Hispanic or Latino  female   G2P0020 with last menstrual period 02/20/19   here for a pre-op for hysterectomy. She has a known fibroid uterus and h/o endometriosis. She has been followed with serial ultrasounds for a stable 2.2 cm, complex left ovarian cyst (suspected endometrioma, adherent to left uterine side wall on ultrasound). Ultrasound in 10/20 7 fibroids were noted, ranging in size from 1.1 cm to 5.4 cm.  She has a h/o a left ectopic pregnancy with laparotomy and left salpingectomy.  She has been on OCP's for cycle control, but her cramps have gotten progressively worse to the point as being severe.  She desires definitive treatment.   Last pap 10/16/16 negative, prior pap on 09/28/15 negative with negative hpv.   GYNECOLOGIC HISTORY: Patient's last menstrual period was 02/20/2019. Contraception: OCP's Menopausal hormone therapy: none        OB History    Gravida  2   Para  0   Term  0   Preterm  0   AB  2   Living  0     SAB  1   TAB      Ectopic  1   Multiple      Live Births                 Patient Active Problem List   Diagnosis Date Noted  . Knee pain 10/16/2016  . Low back pain 10/16/2016    Past Medical History:  Diagnosis Date  . Abnormal Pap smear of cervix   . Anemia   . Asthma    exercise induced  . Infertility, female     Past Surgical History:  Procedure Laterality Date  . SALPINGECTOMY     left due to ectopic, laparotomy. Endometriosis noted.     Current Outpatient Medications  Medication Sig Dispense Refill  . drospirenone-ethinyl estradiol (YAZ) 3-0.02 MG tablet Take 1 tablet by mouth daily. 3 Package 3  . ibuprofen (ADVIL) 800 MG tablet Take 1 tablet (800 mg total) by mouth every 8 (eight) hours as needed. 30 tablet 1   No current facility-administered medications for this visit.     ALLERGIES: Latex  Family History  Problem Relation Age of Onset   . Cancer Maternal Aunt        colon  . Breast cancer Maternal Aunt        does not know age  . Cancer Maternal Grandmother        stomach  . Breast cancer Paternal Aunt     Social History   Socioeconomic History  . Marital status: Married    Spouse name: Not on file  . Number of children: Not on file  . Years of education: Not on file  . Highest education level: Not on file  Occupational History  . Not on file  Tobacco Use  . Smoking status: Never Smoker  . Smokeless tobacco: Never Used  Substance and Sexual Activity  . Alcohol use: No  . Drug use: No  . Sexual activity: Yes    Partners: Male    Birth control/protection: Pill  Other Topics Concern  . Not on file  Social History Narrative  . Not on file   Social Determinants of Health   Financial Resource Strain:   . Difficulty of Paying Living Expenses: Not on file  Food Insecurity:   . Worried About [employer] of  Food in the Last Year: Not on file  . Ran Out of Food in the Last Year: Not on file  Transportation Needs:   . Lack of Transportation (Medical): Not on file  . Lack of Transportation (Non-Medical): Not on file  Physical Activity:   . Days of Exercise per Week: Not on file  . Minutes of Exercise per Session: Not on file  Stress:   . Feeling of Stress : Not on file  Social Connections:   . Frequency of Communication with Friends and Family: Not on file  . Frequency of Social Gatherings with Friends and Family: Not on file  . Attends Religious Services: Not on file  . Active Member of Clubs or Organizations: Not on file  . Attends Archivist Meetings: Not on file  . Marital Status: Not on file  Intimate Partner Violence:   . Fear of Current or Ex-Partner: Not on file  . Emotionally Abused: Not on file  . Physically Abused: Not on file  . Sexually Abused: Not on file    Review of Systems  All other systems reviewed and are negative.   PHYSICAL EXAMINATION:    BP 110/60   Pulse 68    Temp 98.1 F (36.7 C)   Ht 5\' 4"  (1.626 m)   Wt 139 lb 6.4 oz (63.2 kg)   LMP 02/20/2019   SpO2 98%   BMI 23.93 kg/m     General appearance: alert, cooperative and appears stated age Neck: no adenopathy, supple, symmetrical, trachea midline and thyroid normal to inspection and palpation Heart: regular rate and rhythm Lungs: CTAB Abdomen: soft, non-tender; bowel sounds normal; no masses,  no organomegaly Extremities: normal, atraumatic, no cyanosis Skin: normal color, texture and turgor, no rashes or lesions Lymph: normal cervical supraclavicular and inguinal nodes Neurologic: grossly normal   ASSESSMENT Complex left ovarian cyst, suspected endometrioma H/O endometriosis Fibroid uterus Severe dysmenorrhea H/O laparotomy    PLAN Discussed total laparoscopic hysterectomy, right salpingectomy. Left oophorectomy vs ovarian cystectomy and cystoscopy. Reviewed the risks of the procedure, including infection, bleeding, damage to bowel/badder/vessels/ureters. Discussed increased risk of scar tissue and injury with her h/o endometriosis and prior laparotomy. Discussed the possible need for laparotomy. Discussed post operative recovery and risk of cuff dehiscence. All of her questions were answered    An After Visit Summary was printed and given to the patient.

## 2019-03-16 ENCOUNTER — Telehealth: Payer: Self-pay | Admitting: Obstetrics and Gynecology

## 2019-03-16 NOTE — Telephone Encounter (Signed)
Patient is calling to check on FMLA paperwork she brought in to office on the 19th.

## 2019-03-16 NOTE — Telephone Encounter (Signed)
Confirmed FMLA forms are completed and ready to send out. Faxed FMLA forms to Felipa Evener at [phone] per patient request with confirmation. Spoke with patient who verbalizes understanding.  Routing to provider and will close encounter.

## 2019-03-23 ENCOUNTER — Other Ambulatory Visit (HOSPITAL_COMMUNITY): Payer: Self-pay | Admitting: *Deleted

## 2019-03-23 NOTE — Patient Instructions (Addendum)
ONCE YOUR COVID TEST IS DONE PLEASE FOLLOW ALL THE QUARANTINE  INSTRUCTIONS GIVEN IN YOUR HANDOUT.      Your procedure is scheduled on Monday 03/28/2019   Report to [place]. M.   Call this number if you have problems the morning of surgery  :[phone].   OUR ADDRESS IS [place].  WE ARE LOCATED IN [facility].                                     REMEMBER:  DO NOT EAT FOOD OR DRINK LIQUIDS AFTER MIDNIGHT .     YOU MAY  BRUSH YOUR TEETH MORNING OF SURGERY AND RINSE YOUR MOUTH OUT, NO CHEWING GUM CANDY OR MINTS.   TAKE THESE MEDICATIONS MORNING OF SURGERY WITH A SIP OF WATER:   use Albuterol inhaler if needed and bring inhaler with you to the hospital   IF YOU ARE SPENDING THE NIGHT AFTER SURGERY PLEASE BRING ALL YOUR PRESCRIPTION MEDICATIONS IN THEIR ORIGINAL BOTTLES.    1 VISITOR IS ALLOWED IN WAITING ROOM ONLY DAY OF SURGERY. NO VISITOR MAY SPEND THE NIGHT. VISITOR ARE ALLOWED TO STAY UNTIL 800 PM.                                     DO NOT WEAR JEWERLY, MAKE UP, OR NAIL POLISH ON FINGERNAILS. DO NOT WEAR LOTIONS, POWDERS, PERFUMES OR DEODORANT. DO NOT SHAVE FOR 24 HOURS PRIOR TO DAY OF SURGERY.  CONTACTS, GLASSES, OR DENTURES MAY NOT BE WORN TO SURGERY.                                    [facility] IS NOT RESPONSIBLE  FOR ANY BELONGINGS.                                                                    Marland Kitchen                                                                                                    [facility] - Preparing for Surgery Before surgery, you can play an important role.  Because skin is not sterile, your skin needs to be as free of germs as possible.  You can reduce the number of germs on your skin by washing with CHG (chlorahexidine gluconate) soap before surgery.  CHG is an antiseptic cleaner which kills germs and bonds with the skin to continue killing germs even after washing. Please DO  NOT use if you have an allergy to CHG or  antibacterial soaps.  If your skin becomes reddened/irritated stop using the CHG and inform your nurse when you arrive at Short Stay. Do not shave (including legs and underarms) for at least 48 hours prior to the first CHG shower.  You may shave your face/neck. Please follow these instructions carefully:  1.  Shower with CHG Soap the night before surgery and the  morning of Surgery.  2.  If you choose to wash your hair, wash your hair first as usual with your  normal  shampoo.  3.  After you shampoo, rinse your hair and body thoroughly to remove the  shampoo.                           4.  Use CHG as you would any other liquid soap.  You can apply chg directly  to the skin and wash                       Gently with a scrungie or clean washcloth.  5.  Apply the CHG Soap to your body ONLY FROM THE NECK DOWN.   Do not use on face/ open                           Wound or open sores. Avoid contact with eyes, ears mouth and genitals (private parts).                       Wash face,  Genitals (private parts) with your normal soap.             6.  Wash thoroughly, paying special attention to the area where your surgery  will be performed.  7.  Thoroughly rinse your body with warm water from the neck down.  8.  DO NOT shower/wash with your normal soap after using and rinsing off  the CHG Soap.                9.  Pat yourself dry with a clean towel.            10.  Wear clean pajamas.            11.  Place clean sheets on your bed the night of your first shower and do not  sleep with pets. Day of Surgery : Do not apply any lotions/deodorants the morning of surgery.  Please wear clean clothes to the hospital/surgery center.  FAILURE TO FOLLOW THESE INSTRUCTIONS MAY RESULT IN THE CANCELLATION OF YOUR SURGERY PATIENT SIGNATURE_________________________________  NURSE  SIGNATURE__________________________________  ________________________________________________________________________   Adam Phenix  An incentive spirometer is a tool that can help keep your lungs clear and active. This tool measures how well you are filling your lungs with each breath. Taking long deep breaths may help reverse or decrease the chance of developing breathing (pulmonary) problems (especially infection) following:  A long period of time when you are unable to move or be active. BEFORE THE PROCEDURE   If the spirometer includes an indicator to show your best effort, your nurse or respiratory therapist will set it to a desired goal.  If possible, sit up straight or lean slightly forward. Try not to slouch.  Hold the incentive spirometer in an upright position. INSTRUCTIONS FOR USE  1. Sit on the edge of your bed if possible, or sit up as far as you can in bed  or on a chair. 2. Hold the incentive spirometer in an upright position. 3. Breathe out normally. 4. Place the mouthpiece in your mouth and seal your lips tightly around it. 5. Breathe in slowly and as deeply as possible, raising the piston or the ball toward the top of the column. 6. Hold your breath for 3-5 seconds or for as long as possible. Allow the piston or ball to fall to the bottom of the column. 7. Remove the mouthpiece from your mouth and breathe out normally. 8. Rest for a few seconds and repeat Steps 1 through 7 at least 10 times every 1-2 hours when you are awake. Take your time and take a few normal breaths between deep breaths. 9. The spirometer may include an indicator to show your best effort. Use the indicator as a goal to work toward during each repetition. 10. After each set of 10 deep breaths, practice coughing to be sure your lungs are clear. If you have an incision (the cut made at the time of surgery), support your incision when coughing by placing a pillow or rolled up towels firmly  against it. Once you are able to get out of bed, walk around indoors and cough well. You may stop using the incentive spirometer when instructed by your caregiver.  RISKS AND COMPLICATIONS  Take your time so you do not get dizzy or light-headed.  If you are in pain, you may need to take or ask for pain medication before doing incentive spirometry. It is harder to take a deep breath if you are having pain. AFTER USE  Rest and breathe slowly and easily.  It can be helpful to keep track of a log of your progress. Your caregiver can provide you with a simple table to help with this. If you are using the spirometer at home, follow these instructions: [place] IF:   You are having difficultly using the spirometer.  You have trouble using the spirometer as often as instructed.  Your pain medication is not giving enough relief while using the spirometer.  You develop fever of 100.5 F (38.1 C) or higher. SEEK IMMEDIATE MEDICAL CARE IF:   You cough up bloody sputum that had not been present before.  You develop fever of 102 F (38.9 C) or greater.  You develop worsening pain at or near the incision site. MAKE SURE YOU:   Understand these instructions.  Will watch your condition.  Will get help right away if you are not doing well or get worse. Document Released: 06/16/2006 Document Revised: 04/28/2011 Document Reviewed: 08/17/2006 Exit[employer] Patient Information 2014 Exit[employer], [place].   ________________________________________________________________________  WHAT IS A BLOOD TRANSFUSION? Blood Transfusion Information  A transfusion is the replacement of blood or some of its parts. Blood is made up of multiple cells which provide different functions.  Red blood cells carry oxygen and are used for blood loss replacement.  White blood cells fight against infection.  Platelets control bleeding.  Plasma helps clot blood.  Other blood products are available for  specialized needs, such as hemophilia or other clotting disorders. BEFORE THE TRANSFUSION  Who gives blood for transfusions?   Healthy volunteers who are fully evaluated to make sure their blood is safe. This is blood bank blood. Transfusion therapy is the safest it has ever been in the practice of medicine. Before blood is taken from a donor, a complete history is taken to make sure that person has no history of diseases nor engages in risky  social behavior (examples are intravenous drug use or sexual activity with multiple partners). The donor's travel history is screened to minimize risk of transmitting infections, such as malaria. The donated blood is tested for signs of infectious diseases, such as HIV and hepatitis. The blood is then tested to be sure it is compatible with you in order to minimize the chance of a transfusion reaction. If you or a relative donates blood, this is often done in anticipation of surgery and is not appropriate for emergency situations. It takes many days to process the donated blood. RISKS AND COMPLICATIONS Although transfusion therapy is very safe and saves many lives, the main dangers of transfusion include:   Getting an infectious disease.  Developing a transfusion reaction. This is an allergic reaction to something in the blood you were given. Every precaution is taken to prevent this. The decision to have a blood transfusion has been considered carefully by your caregiver before blood is given. Blood is not given unless the benefits outweigh the risks. AFTER THE TRANSFUSION  Right after receiving a blood transfusion, you will usually feel much better and more energetic. This is especially true if your red blood cells have gotten low (anemic). The transfusion raises the level of the red blood cells which carry oxygen, and this usually causes an energy increase.  The nurse administering the transfusion will monitor you carefully for complications. HOME CARE  INSTRUCTIONS  No special instructions are needed after a transfusion. You may find your energy is better. Speak with your caregiver about any limitations on activity for underlying diseases you may have. SEEK MEDICAL CARE IF:   Your condition is not improving after your transfusion.  You develop redness or irritation at the intravenous (IV) site. SEEK IMMEDIATE MEDICAL CARE IF:  Any of the following symptoms occur over the next 12 hours:  Shaking chills.  You have a temperature by mouth above 102 F (38.9 C), not controlled by medicine.  Chest, back, or muscle pain.  People around you feel you are not acting correctly or are confused.  Shortness of breath or difficulty breathing.  Dizziness and fainting.  You get a rash or develop hives.  You have a decrease in urine output.  Your urine turns a dark color or changes to pink, red, or brown. Any of the following symptoms occur over the next 10 days:  You have a temperature by mouth above 102 F (38.9 C), not controlled by medicine.  Shortness of breath.  Weakness after normal activity.  The white part of the eye turns yellow (jaundice).  You have a decrease in the amount of urine or are urinating less often.  Your urine turns a dark color or changes to pink, red, or brown. Document Released: 02/01/2000 Document Revised: 04/28/2011 Document Reviewed: 09/20/2007 [facility] Patient Information 2014 [place], [place].  _______________________________________________________________________

## 2019-03-24 ENCOUNTER — Other Ambulatory Visit (HOSPITAL_COMMUNITY)
Admission: RE | Admit: 2019-03-24 | Discharge: 2019-03-24 | Disposition: A | Discharge status: Home / Self Care | Payer: BC Managed Care – PPO | Source: Ambulatory Visit | Attending: Obstetrics and Gynecology | Admitting: Obstetrics and Gynecology

## 2019-03-24 DIAGNOSIS — Z20822 Contact with and (suspected) exposure to covid-19: Secondary | ICD-10-CM | POA: Insufficient documentation

## 2019-03-24 DIAGNOSIS — Z01812 Encounter for preprocedural laboratory examination: Secondary | ICD-10-CM | POA: Diagnosis not present

## 2019-03-24 LAB — SARS CORONAVIRUS 2 (TAT 6-24 HRS): SARS Coronavirus 2: NEGATIVE

## 2019-03-25 ENCOUNTER — Encounter (HOSPITAL_COMMUNITY)
Admission: RE | Admit: 2019-03-25 | Discharge: 2019-03-25 | Disposition: A | Discharge status: Home / Self Care | Payer: BC Managed Care – PPO | Source: Ambulatory Visit | Attending: Obstetrics and Gynecology | Admitting: Obstetrics and Gynecology

## 2019-03-25 ENCOUNTER — Other Ambulatory Visit: Payer: Self-pay

## 2019-03-25 ENCOUNTER — Encounter (HOSPITAL_COMMUNITY): Payer: Self-pay

## 2019-03-25 DIAGNOSIS — Z01812 Encounter for preprocedural laboratory examination: Secondary | ICD-10-CM | POA: Diagnosis not present

## 2019-03-25 HISTORY — DX: Headache, unspecified: R51.9

## 2019-03-25 HISTORY — DX: Endometriosis, unspecified: N80.9

## 2019-03-25 HISTORY — DX: Anxiety disorder, unspecified: F41.9

## 2019-03-25 LAB — COMPREHENSIVE METABOLIC PANEL
ALT: 17 U/L (ref 0–44)
AST: 19 U/L (ref 15–41)
Albumin: 3.7 g/dL (ref 3.5–5.0)
Alkaline Phosphatase: 34 U/L — ABNORMAL LOW (ref 38–126)
Anion gap: 6 (ref 5–15)
BUN: 10 mg/dL (ref 6–20)
CO2: 24 mmol/L (ref 22–32)
Calcium: 9.1 mg/dL (ref 8.9–10.3)
Chloride: 110 mmol/L (ref 98–111)
Creatinine, Ser: 0.74 mg/dL (ref 0.44–1.00)
GFR calc Af Amer: >60 mL/min (ref >60)
GFR calc non Af Amer: >60 mL/min (ref >60)
Glucose, Bld: 87 mg/dL (ref 70–99)
Potassium: 4.1 mmol/L (ref 3.5–5.1)
Sodium: 140 mmol/L (ref 135–145)
Total Bilirubin: 0.4 mg/dL (ref 0.3–1.2)
Total Protein: 6.8 g/dL (ref 6.5–8.1)

## 2019-03-25 LAB — CBC
HCT: 38.7 % (ref 36.0–46.0)
Hemoglobin: 12.1 g/dL (ref 12.0–15.0)
MCH: 26.6 pg (ref 26.0–34.0)
MCHC: 31.3 g/dL (ref 30.0–36.0)
MCV: 85.1 fL (ref 80.0–100.0)
Platelets: 330 10*3/uL (ref 150–400)
RBC: 4.55 MIL/uL (ref 3.87–5.11)
RDW: 12 % (ref 11.5–15.5)
WBC: 4.7 10*3/uL (ref 4.0–10.5)
nRBC: 0 % (ref 0.0–0.2)

## 2019-03-25 LAB — ABO/RH: ABO/RH(D): A POS

## 2019-03-25 NOTE — Progress Notes (Signed)
PCP - does not have one Cardiologist - n/a  Chest x-ray - n/a EKG - n/a Stress Test - n/a ECHO - n/a Cardiac Cath - n/a  Sleep Study - n/a CPAP - n/a  Fasting Blood Sugar - n/a Checks Blood Sugar __0___ times a day  Blood Thinner Instructions:n/a Aspirin Instructions:takes Advil as needed Last Dose:03/24/2119  Anesthesia review:   Patient has a history of endometriosis, infertility, asthma and anemia.  Patient denies shortness of breath, fever, cough and chest pain at PAT appointment   Patient verbalized understanding of instructions that were given to them at the PAT appointment. Patient was also instructed that they will need to review over the PAT instructions again at home before surgery.

## 2019-03-28 ENCOUNTER — Ambulatory Visit (HOSPITAL_BASED_OUTPATIENT_CLINIC_OR_DEPARTMENT_OTHER): Payer: BC Managed Care – PPO | Admitting: Physician Assistant

## 2019-03-28 ENCOUNTER — Ambulatory Visit (HOSPITAL_BASED_OUTPATIENT_CLINIC_OR_DEPARTMENT_OTHER)
Admission: RE | Admit: 2019-03-28 | Discharge: 2019-03-29 | Disposition: A | Discharge status: Home / Self Care | Payer: BC Managed Care – PPO | Attending: Obstetrics and Gynecology | Admitting: Obstetrics and Gynecology

## 2019-03-28 ENCOUNTER — Encounter (HOSPITAL_BASED_OUTPATIENT_CLINIC_OR_DEPARTMENT_OTHER)
Admission: RE | Disposition: A | Discharge status: Home / Self Care | Payer: Self-pay | Source: Home / Self Care | Attending: Obstetrics and Gynecology

## 2019-03-28 ENCOUNTER — Encounter (HOSPITAL_BASED_OUTPATIENT_CLINIC_OR_DEPARTMENT_OTHER): Payer: Self-pay | Admitting: Obstetrics and Gynecology

## 2019-03-28 ENCOUNTER — Ambulatory Visit (HOSPITAL_BASED_OUTPATIENT_CLINIC_OR_DEPARTMENT_OTHER): Payer: BC Managed Care – PPO | Admitting: Certified Registered"

## 2019-03-28 DIAGNOSIS — Z90710 Acquired absence of both cervix and uterus: Secondary | ICD-10-CM | POA: Diagnosis present

## 2019-03-28 DIAGNOSIS — Z793 Long term (current) use of hormonal contraceptives: Secondary | ICD-10-CM | POA: Insufficient documentation

## 2019-03-28 DIAGNOSIS — N72 Inflammatory disease of cervix uteri: Secondary | ICD-10-CM | POA: Insufficient documentation

## 2019-03-28 DIAGNOSIS — N946 Dysmenorrhea, unspecified: Secondary | ICD-10-CM | POA: Diagnosis not present

## 2019-03-28 DIAGNOSIS — N736 Female pelvic peritoneal adhesions (postinfective): Secondary | ICD-10-CM | POA: Insufficient documentation

## 2019-03-28 DIAGNOSIS — D251 Intramural leiomyoma of uterus: Secondary | ICD-10-CM | POA: Diagnosis not present

## 2019-03-28 DIAGNOSIS — N801 Endometriosis of ovary: Secondary | ICD-10-CM | POA: Diagnosis not present

## 2019-03-28 DIAGNOSIS — N800 Endometriosis of uterus: Secondary | ICD-10-CM | POA: Diagnosis not present

## 2019-03-28 DIAGNOSIS — N809 Endometriosis, unspecified: Secondary | ICD-10-CM | POA: Diagnosis not present

## 2019-03-28 DIAGNOSIS — D259 Leiomyoma of uterus, unspecified: Secondary | ICD-10-CM | POA: Insufficient documentation

## 2019-03-28 DIAGNOSIS — N897 Hematocolpos: Secondary | ICD-10-CM | POA: Diagnosis not present

## 2019-03-28 DIAGNOSIS — N8302 Follicular cyst of left ovary: Secondary | ICD-10-CM | POA: Diagnosis not present

## 2019-03-28 HISTORY — PX: CYSTOSCOPY: SHX5120

## 2019-03-28 HISTORY — PX: TOTAL LAPAROSCOPIC HYSTERECTOMY WITH SALPINGECTOMY: SHX6742

## 2019-03-28 LAB — CBC
HCT: 35.6 % — ABNORMAL LOW (ref 36.0–46.0)
Hemoglobin: 11.3 g/dL — ABNORMAL LOW (ref 12.0–15.0)
MCH: 26.7 pg (ref 26.0–34.0)
MCHC: 31.7 g/dL (ref 30.0–36.0)
MCV: 84.2 fL (ref 80.0–100.0)
Platelets: 309 10*3/uL (ref 150–400)
RBC: 4.23 MIL/uL (ref 3.87–5.11)
RDW: 11.9 % (ref 11.5–15.5)
WBC: 12.8 10*3/uL — ABNORMAL HIGH (ref 4.0–10.5)
nRBC: 0 % (ref 0.0–0.2)

## 2019-03-28 LAB — TYPE AND SCREEN
ABO/RH(D): A POS
Antibody Screen: NEGATIVE

## 2019-03-28 LAB — POCT PREGNANCY, URINE: Preg Test, Ur: NEGATIVE

## 2019-03-28 SURGERY — HYSTERECTOMY, TOTAL, LAPAROSCOPIC, WITH SALPINGECTOMY
Anesthesia: General | Site: Abdomen

## 2019-03-28 MED ORDER — ACETAMINOPHEN 500 MG PO TABS
1000.0000 mg | ORAL_TABLET | ORAL | Status: AC
  Administered 2019-03-28: 1000 mg via ORAL
  Filled 2019-03-28: qty 2

## 2019-03-28 MED ORDER — LIDOCAINE 2% (20 MG/ML) 5 ML SYRINGE
INTRAMUSCULAR | Status: AC
  Filled 2019-03-28: qty 5

## 2019-03-28 MED ORDER — ACETAMINOPHEN 500 MG PO TABS
1000.0000 mg | ORAL_TABLET | Freq: Four times a day (QID) | ORAL | Status: DC
  Administered 2019-03-28 – 2019-03-29 (×4): 1000 mg via ORAL
  Filled 2019-03-28: qty 2

## 2019-03-28 MED ORDER — MENTHOL 3 MG MT LOZG
1.0000 | LOZENGE | OROMUCOSAL | Status: DC | PRN
  Filled 2019-03-28: qty 9

## 2019-03-28 MED ORDER — OXYCODONE HCL 5 MG PO TABS
5.0000 mg | ORAL_TABLET | Freq: Once | ORAL | Status: DC | PRN
  Filled 2019-03-28: qty 1

## 2019-03-28 MED ORDER — ENOXAPARIN SODIUM 40 MG/0.4ML ~~LOC~~ SOLN
40.0000 mg | SUBCUTANEOUS | Status: AC
  Administered 2019-03-28: 40 mg via SUBCUTANEOUS
  Filled 2019-03-28: qty 0.4

## 2019-03-28 MED ORDER — KETOROLAC TROMETHAMINE 30 MG/ML IJ SOLN
INTRAMUSCULAR | Status: AC
  Filled 2019-03-28: qty 1

## 2019-03-28 MED ORDER — OXYCODONE HCL 5 MG PO TABS
ORAL_TABLET | ORAL | Status: AC
  Filled 2019-03-28: qty 1

## 2019-03-28 MED ORDER — ROCURONIUM BROMIDE 10 MG/ML (PF) SYRINGE
PREFILLED_SYRINGE | INTRAVENOUS | Status: AC
  Filled 2019-03-28: qty 10

## 2019-03-28 MED ORDER — OXYCODONE HCL 5 MG/5ML PO SOLN
5.0000 mg | Freq: Once | ORAL | Status: DC | PRN
  Filled 2019-03-28: qty 5

## 2019-03-28 MED ORDER — FENTANYL CITRATE (PF) 100 MCG/2ML IJ SOLN
INTRAMUSCULAR | Status: DC | PRN
  Administered 2019-03-28 (×2): 50 ug via INTRAVENOUS
  Administered 2019-03-28: 100 ug via INTRAVENOUS
  Administered 2019-03-28: 50 ug via INTRAVENOUS

## 2019-03-28 MED ORDER — ALUM & MAG HYDROXIDE-SIMETH 200-200-20 MG/5ML PO SUSP
30.0000 mL | ORAL | Status: DC | PRN
  Filled 2019-03-28: qty 30

## 2019-03-28 MED ORDER — CEFAZOLIN SODIUM-DEXTROSE 2-4 GM/100ML-% IV SOLN
INTRAVENOUS | Status: AC
  Filled 2019-03-28: qty 100

## 2019-03-28 MED ORDER — IBUPROFEN 800 MG PO TABS
800.0000 mg | ORAL_TABLET | Freq: Four times a day (QID) | ORAL | Status: DC
  Filled 2019-03-28: qty 1

## 2019-03-28 MED ORDER — OXYCODONE HCL 5 MG PO TABS
ORAL_TABLET | ORAL | Status: AC
  Filled 2019-03-28: qty 2

## 2019-03-28 MED ORDER — OXYCODONE HCL 5 MG PO TABS
5.0000 mg | ORAL_TABLET | ORAL | Status: DC | PRN
  Administered 2019-03-28 (×2): 5 mg via ORAL
  Administered 2019-03-28 – 2019-03-29 (×3): 10 mg via ORAL
  Filled 2019-03-28: qty 2

## 2019-03-28 MED ORDER — PROMETHAZINE HCL 25 MG/ML IJ SOLN
6.2500 mg | INTRAMUSCULAR | Status: DC | PRN
  Filled 2019-03-28: qty 1

## 2019-03-28 MED ORDER — ENOXAPARIN SODIUM 40 MG/0.4ML ~~LOC~~ SOLN
40.0000 mg | SUBCUTANEOUS | Status: DC
  Filled 2019-03-28: qty 0.4

## 2019-03-28 MED ORDER — ZOLPIDEM TARTRATE 5 MG PO TABS
5.0000 mg | ORAL_TABLET | Freq: Every evening | ORAL | Status: DC | PRN
  Filled 2019-03-28: qty 1

## 2019-03-28 MED ORDER — HYDROMORPHONE HCL 1 MG/ML IJ SOLN
0.2500 mg | INTRAMUSCULAR | Status: DC | PRN
  Filled 2019-03-28: qty 0.5

## 2019-03-28 MED ORDER — ACETAMINOPHEN 500 MG PO TABS
ORAL_TABLET | ORAL | Status: AC
  Filled 2019-03-28: qty 2

## 2019-03-28 MED ORDER — LACTATED RINGERS IV SOLN
INTRAVENOUS | Status: DC
  Filled 2019-03-28: qty 1000

## 2019-03-28 MED ORDER — SUGAMMADEX SODIUM 200 MG/2ML IV SOLN
INTRAVENOUS | Status: DC | PRN
  Administered 2019-03-28: 150 mg via INTRAVENOUS

## 2019-03-28 MED ORDER — MEPERIDINE HCL 25 MG/ML IJ SOLN
6.2500 mg | INTRAMUSCULAR | Status: DC | PRN
  Filled 2019-03-28: qty 1

## 2019-03-28 MED ORDER — BUPIVACAINE HCL (PF) 0.5 % IJ SOLN
INTRAMUSCULAR | Status: DC | PRN
  Administered 2019-03-28: 10 mL

## 2019-03-28 MED ORDER — MIDAZOLAM HCL 2 MG/2ML IJ SOLN
INTRAMUSCULAR | Status: AC
  Filled 2019-03-28: qty 2

## 2019-03-28 MED ORDER — KCL IN DEXTROSE-NACL 20-5-0.45 MEQ/L-%-% IV SOLN
INTRAVENOUS | Status: DC
  Filled 2019-03-28 (×3): qty 1000

## 2019-03-28 MED ORDER — ROPIVACAINE HCL 5 MG/ML IJ SOLN
INTRAMUSCULAR | Status: DC | PRN
  Administered 2019-03-28: 30 mL

## 2019-03-28 MED ORDER — ENOXAPARIN SODIUM 40 MG/0.4ML ~~LOC~~ SOLN
SUBCUTANEOUS | Status: AC
  Filled 2019-03-28: qty 0.4

## 2019-03-28 MED ORDER — SODIUM CHLORIDE 0.9 % IR SOLN
Status: DC | PRN
  Administered 2019-03-28: 1000 mL

## 2019-03-28 MED ORDER — PROPOFOL 10 MG/ML IV BOLUS
INTRAVENOUS | Status: DC | PRN
  Administered 2019-03-28: 180 mg via INTRAVENOUS

## 2019-03-28 MED ORDER — PROPOFOL 10 MG/ML IV BOLUS
INTRAVENOUS | Status: AC
  Filled 2019-03-28: qty 20

## 2019-03-28 MED ORDER — LIDOCAINE 2% (20 MG/ML) 5 ML SYRINGE
INTRAMUSCULAR | Status: DC | PRN
  Administered 2019-03-28: 1.5 mg/kg/h via INTRAVENOUS

## 2019-03-28 MED ORDER — GABAPENTIN 300 MG PO CAPS
ORAL_CAPSULE | ORAL | Status: AC
  Filled 2019-03-28: qty 1

## 2019-03-28 MED ORDER — ONDANSETRON HCL 4 MG/2ML IJ SOLN
4.0000 mg | Freq: Four times a day (QID) | INTRAMUSCULAR | Status: DC | PRN
  Filled 2019-03-28: qty 2

## 2019-03-28 MED ORDER — GABAPENTIN 300 MG PO CAPS
300.0000 mg | ORAL_CAPSULE | ORAL | Status: AC
  Administered 2019-03-28: 300 mg via ORAL
  Filled 2019-03-28: qty 1

## 2019-03-28 MED ORDER — FENTANYL CITRATE (PF) 250 MCG/5ML IJ SOLN
INTRAMUSCULAR | Status: AC
  Filled 2019-03-28: qty 5

## 2019-03-28 MED ORDER — ONDANSETRON HCL 4 MG/2ML IJ SOLN
INTRAMUSCULAR | Status: AC
  Filled 2019-03-28: qty 2

## 2019-03-28 MED ORDER — HYDROMORPHONE HCL 1 MG/ML IJ SOLN
0.2000 mg | INTRAMUSCULAR | Status: DC | PRN
  Filled 2019-03-28: qty 1

## 2019-03-28 MED ORDER — KETAMINE HCL 10 MG/ML IJ SOLN
INTRAMUSCULAR | Status: DC | PRN
  Administered 2019-03-28: 30 mg via INTRAVENOUS

## 2019-03-28 MED ORDER — MIDAZOLAM HCL 5 MG/5ML IJ SOLN
INTRAMUSCULAR | Status: DC | PRN
  Administered 2019-03-28: 2 mg via INTRAVENOUS

## 2019-03-28 MED ORDER — ROCURONIUM BROMIDE 10 MG/ML (PF) SYRINGE
PREFILLED_SYRINGE | INTRAVENOUS | Status: DC | PRN
  Administered 2019-03-28: 10 mg via INTRAVENOUS
  Administered 2019-03-28: 70 mg via INTRAVENOUS

## 2019-03-28 MED ORDER — DOCUSATE SODIUM 100 MG PO CAPS
ORAL_CAPSULE | ORAL | Status: AC
  Filled 2019-03-28: qty 1

## 2019-03-28 MED ORDER — SODIUM CHLORIDE 0.9 % IR SOLN
Status: DC | PRN
  Administered 2019-03-28: 1000 mL via INTRAVESICAL

## 2019-03-28 MED ORDER — SODIUM CHLORIDE (PF) 0.9 % IJ SOLN
INTRAMUSCULAR | Status: DC | PRN
  Administered 2019-03-28: 30 mL

## 2019-03-28 MED ORDER — LIDOCAINE 2% (20 MG/ML) 5 ML SYRINGE
INTRAMUSCULAR | Status: DC | PRN
  Administered 2019-03-28: 80 mg via INTRAVENOUS

## 2019-03-28 MED ORDER — KETOROLAC TROMETHAMINE 30 MG/ML IJ SOLN
30.0000 mg | Freq: Once | INTRAMUSCULAR | Status: AC
  Administered 2019-03-28: 30 mg via INTRAVENOUS
  Filled 2019-03-28: qty 1

## 2019-03-28 MED ORDER — ONDANSETRON HCL 4 MG PO TABS
4.0000 mg | ORAL_TABLET | Freq: Four times a day (QID) | ORAL | Status: DC | PRN
  Filled 2019-03-28: qty 1

## 2019-03-28 MED ORDER — DEXAMETHASONE SODIUM PHOSPHATE 10 MG/ML IJ SOLN
INTRAMUSCULAR | Status: DC | PRN
  Administered 2019-03-28: 10 mg via INTRAVENOUS

## 2019-03-28 MED ORDER — CEFAZOLIN SODIUM-DEXTROSE 2-4 GM/100ML-% IV SOLN
2.0000 g | INTRAVENOUS | Status: AC
  Administered 2019-03-28: 08:00:00 2 g via INTRAVENOUS
  Filled 2019-03-28: qty 100

## 2019-03-28 MED ORDER — KETOROLAC TROMETHAMINE 30 MG/ML IJ SOLN
30.0000 mg | Freq: Four times a day (QID) | INTRAMUSCULAR | Status: DC
  Administered 2019-03-28 – 2019-03-29 (×3): 30 mg via INTRAVENOUS
  Filled 2019-03-28: qty 1

## 2019-03-28 MED ORDER — KETAMINE HCL 10 MG/ML IJ SOLN
INTRAMUSCULAR | Status: AC
  Filled 2019-03-28: qty 1

## 2019-03-28 MED ORDER — DOCUSATE SODIUM 100 MG PO CAPS
100.0000 mg | ORAL_CAPSULE | Freq: Two times a day (BID) | ORAL | Status: DC
  Administered 2019-03-28: 100 mg via ORAL
  Filled 2019-03-28: qty 1

## 2019-03-28 MED ORDER — ALBUTEROL SULFATE HFA 108 (90 BASE) MCG/ACT IN AERS
2.0000 | INHALATION_SPRAY | Freq: Four times a day (QID) | RESPIRATORY_TRACT | Status: DC | PRN
  Filled 2019-03-28: qty 6.7

## 2019-03-28 MED ORDER — ONDANSETRON HCL 4 MG/2ML IJ SOLN
INTRAMUSCULAR | Status: DC | PRN
  Administered 2019-03-28: 4 mg via INTRAVENOUS

## 2019-03-28 SURGICAL SUPPLY — 72 items
APPLICATOR ARISTA FLEXITIP XL (MISCELLANEOUS) ×4 IMPLANT
APPLICATOR COTTON TIP 6 STRL (MISCELLANEOUS) ×2 IMPLANT
APPLICATOR COTTON TIP 6IN STRL (MISCELLANEOUS) ×4
BLADE 10 SAFETY STRL DISP (BLADE) ×4 IMPLANT
BLADE SURG 10 STRL SS (BLADE) ×4 IMPLANT
CABLE HIGH FREQUENCY MONO STRZ (ELECTRODE) IMPLANT
CANISTER SUCT 3000ML PPV (MISCELLANEOUS) ×4 IMPLANT
CELL SAVER LIPIGURD (MISCELLANEOUS) ×2 IMPLANT
COVER MAYO STAND STRL (DRAPES) ×4 IMPLANT
COVER TABLE BACK 60X90 (DRAPES) IMPLANT
COVER WAND RF STERILE (DRAPES) ×4 IMPLANT
DECANTER SPIKE VIAL GLASS SM (MISCELLANEOUS) IMPLANT
DERMABOND ADVANCED (GAUZE/BANDAGES/DRESSINGS) ×2
DERMABOND ADVANCED .7 DNX12 (GAUZE/BANDAGES/DRESSINGS) ×2 IMPLANT
DRSG COVADERM PLUS 2X2 (GAUZE/BANDAGES/DRESSINGS) IMPLANT
DRSG OPSITE POSTOP 3X4 (GAUZE/BANDAGES/DRESSINGS) IMPLANT
DURAPREP 26ML APPLICATOR (WOUND CARE) ×4 IMPLANT
EXTRT SYSTEM ALEXIS 14CM (MISCELLANEOUS) ×4
EXTRT SYSTEM ALEXIS 17CM (MISCELLANEOUS)
GAUZE 4X4 16PLY RFD (DISPOSABLE) ×4 IMPLANT
GLOVE BIO SURGEON STRL SZ 6.5 (GLOVE) ×3 IMPLANT
GLOVE BIO SURGEONS STRL SZ 6.5 (GLOVE) ×1
GLOVE BIOGEL PI IND STRL 7.0 (GLOVE) ×8 IMPLANT
GLOVE BIOGEL PI INDICATOR 7.0 (GLOVE) ×8
GOWN STRL REUS W/TWL LRG LVL3 (GOWN DISPOSABLE) ×16 IMPLANT
HARMONIC RUM II 2.5CM SILVER (DISPOSABLE) ×4
HARMONIC RUM II 3.0CM SILVER (DISPOSABLE)
HARMONIC RUM II 3.5CM SILVER (DISPOSABLE)
HARMONIC RUM II 4.0CM SILVER (DISPOSABLE)
HEMOSTAT ARISTA ABSORB 3G PWDR (HEMOSTASIS) ×4 IMPLANT
LEGGING LITHOTOMY PAIR STRL (DRAPES) ×4 IMPLANT
LIGASURE BLUNT TIP 5 LONG 44CM (ELECTROSURGICAL) ×4 IMPLANT
LIGASURE VESSEL 5MM BLUNT TIP (ELECTROSURGICAL) ×4 IMPLANT
NEEDLE INSUFFLATION 120MM (ENDOMECHANICALS) ×4 IMPLANT
PACK LAPAROSCOPY BASIN (CUSTOM PROCEDURE TRAY) ×4 IMPLANT
PACK TRENDGUARD 450 HYBRID PRO (MISCELLANEOUS) ×2 IMPLANT
POUCH LAPAROSCOPIC INSTRUMENT (MISCELLANEOUS) ×4 IMPLANT
PROTECTOR NERVE ULNAR (MISCELLANEOUS) ×8 IMPLANT
RETRACTOR WOUND ALXS 19CM XSML (INSTRUMENTS) IMPLANT
RTRCTR WOUND ALEXIS 19CM XSML (INSTRUMENTS)
SCALPEL HRMNC RUM II 2.5 SILVR (DISPOSABLE) ×2 IMPLANT
SCALPEL HRMNC RUM II 3.0 SILVR (DISPOSABLE) IMPLANT
SCALPEL HRMNC RUM II 3.5 SILVR (DISPOSABLE) IMPLANT
SCALPEL HRMNC RUM II 4.0 SILVR (DISPOSABLE) IMPLANT
SCISSORS LAP 5X35 DISP (ENDOMECHANICALS) ×4 IMPLANT
SET IRRIG TUBING LAPAROSCOPIC (IRRIGATION / IRRIGATOR) ×4 IMPLANT
SET IRRIG Y TYPE TUR BLADDER L (SET/KITS/TRAYS/PACK) ×4 IMPLANT
SET TRI-LUMEN FLTR TB AIRSEAL (TUBING) ×4 IMPLANT
SHEARS HARMONIC ACE PLUS 36CM (ENDOMECHANICALS) ×4 IMPLANT
SLEEVE ADV FIXATION 5X100MM (TROCAR) ×4 IMPLANT
SUT VIC AB 0 CT1 27 (SUTURE) ×2
SUT VIC AB 0 CT1 27XBRD ANTBC (SUTURE) ×2 IMPLANT
SUT VIC AB 0 CT1 36 (SUTURE) ×4 IMPLANT
SUT VICRYL 0 UR6 27IN ABS (SUTURE) IMPLANT
SUT VICRYL 4-0 PS2 18IN ABS (SUTURE) ×4 IMPLANT
SUT VLOC 180 0 9IN  GS21 (SUTURE) ×2
SUT VLOC 180 0 9IN GS21 (SUTURE) ×2 IMPLANT
SYR 50ML LL SCALE MARK (SYRINGE) ×8 IMPLANT
SYSTEM CONTND EXTRCTN KII BLLN (MISCELLANEOUS) IMPLANT
TIP RUMI ORANGE 6.7MMX12CM (TIP) IMPLANT
TIP UTERINE 5.1X6CM LAV DISP (MISCELLANEOUS) IMPLANT
TIP UTERINE 6.7X10CM GRN DISP (MISCELLANEOUS) ×4 IMPLANT
TIP UTERINE 6.7X6CM WHT DISP (MISCELLANEOUS) IMPLANT
TIP UTERINE 6.7X8CM BLUE DISP (MISCELLANEOUS) IMPLANT
TOWEL OR 17X26 10 PK STRL BLUE (TOWEL DISPOSABLE) ×4 IMPLANT
TRAY FOLEY W/BAG SLVR 14FR (SET/KITS/TRAYS/PACK) ×4 IMPLANT
TRENDGUARD 450 HYBRID PRO PACK (MISCELLANEOUS) ×4
TROCAR ADV FIXATION 5X100MM (TROCAR) ×4 IMPLANT
TROCAR PORT AIRSEAL 5X120 (TROCAR) ×4 IMPLANT
TROCAR XCEL NON BLADE 8MM B8LT (ENDOMECHANICALS) ×4 IMPLANT
TROCAR XCEL NON-BLD 5MMX100MML (ENDOMECHANICALS) ×4 IMPLANT
WARMER LAPAROSCOPE (MISCELLANEOUS) ×4 IMPLANT

## 2019-03-28 NOTE — Progress Notes (Signed)
Day of Surgery Procedure(s) (LRB): TOTAL LAPAROSCOPIC HYSTERECTOMY WITH RIGHT SALPINGECTOMY/LEFT OVARIAN CYSTECTOMY WITH LEFT OOPHERECTOMY (Bilateral) CYSTOSCOPY (N/A)  Subjective: Patient reports she is doing well, ambulating, voiding, tolerating po  Objective: I have reviewed patient's vital signs, intake and output and labs.  Today's Vitals   03/28/19 1302 03/28/19 1442 03/28/19 1547 03/28/19 1632  BP: 127/84   (!) 145/90  Pulse: 79   88  Resp: 15   16  Temp: (!) 97.1 F (36.2 C)   98.8 F (37.1 C)  TempSrc:      SpO2: 100%   100%  Weight:      Height:      PainSc: 3  6  2      No intake/output data recorded. Total I/O In: [id number] [P.O.:1080; I.V.:2300] Out: 1150 [Urine:1050; Blood:100]   CBC Latest Ref Rng & Units 03/28/2019 03/25/2019 11/25/2018  WBC 4.0 - 10.5 K/uL 12.8(H) 4.7 5.5  Hemoglobin 12.0 - 15.0 g/dL 11.3(L) 12.1 11.9  Hematocrit 36.0 - 46.0 % 35.6(L) 38.7 37.5  Platelets 150 - 400 K/uL 309 330 376     General: alert, cooperative and no distress Resp: clear to auscultation bilaterally Cardio: S1, S2 normal Extremities: extremities normal, atraumatic, no cyanosis or edema Abdomen: soft, distended, not tender, +BS Incisions: clean, dry and intact without erythema  Assessment: s/p Procedure(s) with comments: TOTAL LAPAROSCOPIC HYSTERECTOMY WITH RIGHT SALPINGECTOMY/LEFT OVARIAN CYSTECTOMY WITH LEFT OOPHERECTOMY (Bilateral) - LEFT OVARIAN CYSTECTOMY WITH POSSIBLE LEFT OOPHERECTOMY/EXTENDED RECOVERY BED/3 hour surgery time/ks CYSTOSCOPY (N/A): stable and progressing well  Plan: Advance diet Encourage ambulation  Plan d/c home tomorrow  LOS: 0 days    Salvadore Dom 03/28/2019, 5:33 PM

## 2019-03-28 NOTE — Op Note (Signed)
Preoperative Diagnosis: fibroid uterus, endometriosis, severe dysmenorrhea  Postoperative Diagnosis: fibroid uterus, endometriosis, severe dysmenorrhea, severe abdominal and pelvic adhesions   Procedure:  Total Laparoscopic Hysterectomy with right salpingectomy, left oophorectomy, extensive lysis of adhesions and cystoscopy  Surgeon: Dr Sumner Boast  Assistant: Dr Edwinna Areola, an MD assistant was necessary for tissue manipulation, retraction and positioning due to the complexity of the case and hospital policies  Consultation: Dr Zenaida Niece  Anesthesia: General  EBL: 100 cc  Fluids: 1,700 cc LR  Urine output: 150 cc  Uterine weight 282 grams.   Complications: none  Indications for surgery: The patient is a 49 year old female with a known fibroid uterus and history of endometriosis who presented c/o worsening, severe dysmenorrhea on OCP's. She has been followed with serial ultrasounds for a suspected left endometrioma. Her history was also significant for a prior laparotomy with left salpingectomy for an ectopic pregnancy.  The patient is aware of the risks and complications involved with the surgery and consent was obtained prior to the procedure.  Findings: Severe adhesions of the bowel and omentum to the anterior abdominal wall, severe adhesions of the bowel to the uterus and left ovary, fibroid uterus, left endometrioma, right hematosalpinx, normal right ovary, filmy adhesions between the liver and the abdominal wall.   Procedure: The patient was taken to the operating room with an IV in placed, preoperative antibiotics had been administered. She was placed in the dorsal lithotomy position. General anesthesia was administered. She was prepped and draped in the usual sterile fashion for an abdominal, vaginal surgery. A rumi uterine manipulator was placed, using a # 2.5 cup and a 10 cm extender. A foley catheter was placed.    The umbilicus was everted, injected with 0.25%  marcaine and incised with a # 11 blade. 2 towel clips were used to elevated the umbilicus and a veress needle was placed into the abdominal cavity. The abdominal cavity was insufflated with CO2, with normal intraabdominal pressures. After adequate pneumo-insufflation the veress needle was removed and the 5 mm laparoscope was placed into the abdominal cavity using the opti-view trocar. The patient was placed in trendelenburg and the abdominal pelvic cavity was inspected. 2 more trocars were placed: 1 in each lower quadrant approximately 3 cm medial to and superior to the anterior superior iliac spine bilaterally. The areas were injected with 0.5% marcaine and incised with a #11 blade a #5 airseal trocar was placed in the RLQ and a #5 trocar in the LLQ. The next 1.5 hours was used carefully taking down the extensive adhesions. Dr Hassell Done was called for a consultation secondary to the severe adhesions and to look at a portion of the small bowel where there was slight irritation of the serosa. Dr Hassell Done inspected the area and felt it was fine, no suturing was needed. He stayed present during the rest of the lysis of adhesions. Once the midline abdominal wall adhesions were taken down another trocar was placed in the midline approximately 6 cm above the pubic symphysis. This are was injected with marcaine, incised with a #11 blade and a #8 trocar was placed under direct visualization with the laparoscope.   The right ureter was identified on the pelvic side wall, the left was not noted to peristalse on the pelvic side wall, so retroperitoneal dissection was needed. The left round ligament was cauterized and cut with the ligasure device. The retroperitoneal space was developed the the left ureter was identified to be safely away from the  infundibulopelvic ligament. The left infundibulopelvic ligament was cauterized and cut with the ligasure device. The mesovarium was taken down. The  the anterior and posterior leafs of  the broad ligament were taken down with the ligasure device. The harmonic scalpel was then used to take down the bladder flap and skeltonize the vessels. The left uterine vessels were then clamped, cauterized and ligated with the ligasure device. Hemostasis was excellent.  The right tube was elevated from the pelvic sidewall, cauterized and cut with the ligasure device. The tube was so swollen that the hematosalpinx was entered to drain the tube and make removal easier. The mesosalpinx was cauterized and cut with the ligasure device. The tube was separated from the uterus using the ligasure device and removed through the midline trocar. The right uterine ovarian ligament was cauterized and cut with the ligasure device. The right round ligament was cauterized and cut with the ligasure device and the anterior and posterior leafs of the broad ligament were taken down with the ligasure device. The harmonic scalpel was then used to take down the bladder flap and skeltonize the vessels. The right uterine vessels were then clamped, cauterized and ligated with the ligasure device. Hemostasis was excellent.   Using the rumi manipulator the uterus was pushed up in the pelvic cavity and the harmonic scalpel was used to separate the cervix from the vagina using the harmonic energy. The rumi uterine manipulator was removed. An alexis bag was placed through the vagina into the peritoneal cavity and the uterus was placed in the bag. The end of the bag was removed vaginally and the uterus was morcellated within the bag and removed vaginally. The bag remained intact.  An occluder was placed in the vagina to maintain pneumoperitoneum. The vaginal cuff was then closed with a 0 V-lock suture. Hemostasis was excellent. The abdominal pelvic cavity was irrigated and suctioned dry. Pressure was released and hemostasis remained excellent.   The abdominal cavity was desufflated and the trocars were removed. The skin was closed with  subcuticular stiches of 4-0 vicryl and dermabond was placed over the incisions.  The foley catheter was removed and cystoscopy was performed using a 70 degree scope. Both ureters expelled urine, no bladder abnormalities were noted. The bladder was allowed to drain and the cystoscope was removed.   The patient's abdomen and perineum were cleansed and she was taken out of the dorsal lithotomy position. Upon awakening she was extubated and taken to the recovery room in stable condition. The sponge and instrument counts were correct.

## 2019-03-28 NOTE — Interval H&P Note (Signed)
History and Physical Interval Note:  03/28/2019 7:12 AM  Angela Glover  has presented today for surgery, with the diagnosis of Fibroid uterus Severe dysmenorrhea Complex left ovarian cyst, suspected endometrioma H/O endometriosis.  The various methods of treatment have been discussed with the patient and family. After consideration of risks, benefits and other options for treatment, the patient has consented to  Procedure(s) with comments: TOTAL LAPAROSCOPIC HYSTERECTOMY WITH SALPINGECTOMY/LEFT OVARIAN CYSTECTOMY WITH POSSIBLE LEFT OOPHERECTOMY/ks (Bilateral) - LEFT OVARIAN CYSTECTOMY WITH POSSIBLE LEFT OOPHERECTOMY/EXTENDED RECOVERY BED/3 hour surgery time/ks CYSTOSCOPY (N/A) as a surgical intervention.  The patient's history has been reviewed, patient examined, no change in status, stable for surgery.  I have reviewed the patient's chart and labs.  Questions were answered to the patient's satisfaction.     Salvadore Dom

## 2019-03-28 NOTE — Anesthesia Postprocedure Evaluation (Signed)
Anesthesia Post Note  Patient: Angela Glover  Procedure(s) Performed: TOTAL LAPAROSCOPIC HYSTERECTOMY WITH RIGHT SALPINGECTOMY/LEFT OVARIAN CYSTECTOMY WITH LEFT OOPHERECTOMY (Bilateral Abdomen) CYSTOSCOPY (N/A )     Patient location during evaluation: PACU Anesthesia Type: General Level of consciousness: awake and alert Pain management: pain level controlled Vital Signs Assessment: post-procedure vital signs reviewed and stable Respiratory status: spontaneous breathing, nonlabored ventilation and respiratory function stable Cardiovascular status: blood pressure returned to baseline and stable Postop Assessment: no apparent nausea or vomiting Anesthetic complications: no    Last Vitals:  Vitals:   03/28/19 1157 03/28/19 1218  BP: (!) 151/94 135/87  Pulse: 92 83  Resp: 16   Temp: 37.1 C (!) 35.9 C  SpO2: 100% 98%    Last Pain:  Vitals:   03/28/19 1218  TempSrc:   PainSc: [place]

## 2019-03-28 NOTE — Anesthesia Procedure Notes (Signed)
Procedure Name: Intubation Date/Time: 03/28/2019 7:36 AM Performed by: Shareese Macha D, CRNA Pre-anesthesia Checklist: Patient identified, Emergency Drugs available, Suction available and Patient being monitored Patient Re-evaluated:Patient Re-evaluated prior to induction Oxygen Delivery Method: Circle system utilized Preoxygenation: Pre-oxygenation with 100% oxygen Induction Type: IV induction Ventilation: Mask ventilation without difficulty Laryngoscope Size: Mac and 3 Grade View: Grade I Tube type: Oral Tube size: 7.0 mm Number of attempts: 1 Airway Equipment and Method: Stylet and Oral airway Placement Confirmation: ETT inserted through vocal cords under direct vision,  positive ETCO2 and breath sounds checked- equal and bilateral Secured at: 21 cm Tube secured with: Tape Dental Injury: Teeth and Oropharynx as per pre-operative assessment

## 2019-03-28 NOTE — Anesthesia Preprocedure Evaluation (Signed)
Anesthesia Evaluation  Patient identified by MRN, date of birth, ID band Patient awake    Reviewed: Allergy & Precautions, NPO status , Patient's Chart, lab work & pertinent test results  Airway Mallampati: II  TM Distance: >3 FB Neck ROM: Full    Dental no notable dental hx.    Pulmonary asthma ,    Pulmonary exam normal breath sounds clear to auscultation       Cardiovascular negative cardio ROS Normal cardiovascular exam Rhythm:Regular Rate:Normal     Neuro/Psych  Headaches, Anxiety negative psych ROS   GI/Hepatic negative GI ROS, Neg liver ROS,   Endo/Other  negative endocrine ROS  Renal/GU negative Renal ROS  negative genitourinary   Musculoskeletal negative musculoskeletal ROS (+)   Abdominal   Peds negative pediatric ROS (+)  Hematology negative hematology ROS (+)   Anesthesia Other Findings   Reproductive/Obstetrics negative OB ROS                             Anesthesia Physical Anesthesia Plan  ASA: II  Anesthesia Plan: General   Post-op Pain Management:    Induction: Intravenous  PONV Risk Score and Plan: 3 and Ondansetron, Dexamethasone, Midazolam and Treatment may vary due to age or medical condition  Airway Management Planned: Oral ETT  Additional Equipment:   Intra-op Plan:   Post-operative Plan: Extubation in OR  Informed Consent: I have reviewed the patients History and Physical, chart, labs and discussed the procedure including the risks, benefits and alternatives for the proposed anesthesia with the patient or authorized representative who has indicated his/her understanding and acceptance.     Dental advisory given  Plan Discussed with: CRNA  Anesthesia Plan Comments:         Anesthesia Quick Evaluation

## 2019-03-28 NOTE — Transfer of Care (Signed)
Immediate Anesthesia Transfer of Care Note  Patient: Angela Glover  Procedure(s) Performed: TOTAL LAPAROSCOPIC HYSTERECTOMY WITH RIGHT SALPINGECTOMY/LEFT OVARIAN CYSTECTOMY WITH LEFT OOPHERECTOMY (Bilateral Abdomen) CYSTOSCOPY (N/A )  Patient Location: PACU  Anesthesia Type:General  Level of Consciousness: awake, alert  and oriented  Airway & Oxygen Therapy: Patient Spontanous Breathing and Patient connected to nasal cannula oxygen  Post-op Assessment: Report given to RN and Post -op Vital signs reviewed and stable  Post vital signs: Reviewed and stable  Last Vitals:  Vitals Value Taken Time  BP    Temp    Pulse    Resp    SpO2      Last Pain:  Vitals:   03/28/19 0607  TempSrc: Oral  PainSc: 0-No pain      Patients Stated Pain Goal: 4 ([id number])  Complications: No apparent anesthesia complications

## 2019-03-29 DIAGNOSIS — N801 Endometriosis of ovary: Secondary | ICD-10-CM | POA: Diagnosis not present

## 2019-03-29 DIAGNOSIS — Z793 Long term (current) use of hormonal contraceptives: Secondary | ICD-10-CM | POA: Diagnosis not present

## 2019-03-29 DIAGNOSIS — N946 Dysmenorrhea, unspecified: Secondary | ICD-10-CM | POA: Diagnosis not present

## 2019-03-29 DIAGNOSIS — N736 Female pelvic peritoneal adhesions (postinfective): Secondary | ICD-10-CM | POA: Diagnosis not present

## 2019-03-29 DIAGNOSIS — D259 Leiomyoma of uterus, unspecified: Secondary | ICD-10-CM | POA: Diagnosis not present

## 2019-03-29 DIAGNOSIS — N800 Endometriosis of uterus: Secondary | ICD-10-CM | POA: Diagnosis not present

## 2019-03-29 DIAGNOSIS — N8302 Follicular cyst of left ovary: Secondary | ICD-10-CM | POA: Diagnosis not present

## 2019-03-29 DIAGNOSIS — N72 Inflammatory disease of cervix uteri: Secondary | ICD-10-CM | POA: Diagnosis not present

## 2019-03-29 LAB — SURGICAL PATHOLOGY

## 2019-03-29 MED ORDER — OXYCODONE HCL 5 MG PO TABS
ORAL_TABLET | ORAL | Status: AC
  Filled 2019-03-29: qty 2

## 2019-03-29 MED ORDER — ACETAMINOPHEN 500 MG PO TABS: 1000.0000 mg | ORAL_TABLET | Freq: Four times a day (QID) | ORAL | 0 refills | Status: DC

## 2019-03-29 MED ORDER — KETOROLAC TROMETHAMINE 30 MG/ML IJ SOLN
INTRAMUSCULAR | Status: AC
  Filled 2019-03-29: qty 1

## 2019-03-29 MED ORDER — OXYCODONE HCL 5 MG PO TABS: 5.0000 mg | ORAL_TABLET | ORAL | 0 refills | Status: DC | PRN

## 2019-03-29 MED ORDER — DOCUSATE SODIUM 100 MG PO CAPS: 100.0000 mg | ORAL_CAPSULE | Freq: Two times a day (BID) | ORAL | 0 refills | Status: DC

## 2019-03-29 MED ORDER — ACETAMINOPHEN 500 MG PO TABS
ORAL_TABLET | ORAL | Status: AC
  Filled 2019-03-29: qty 2

## 2019-03-29 MED ORDER — IBUPROFEN 800 MG PO TABS: 800.0000 mg | ORAL_TABLET | Freq: Three times a day (TID) | ORAL | 1 refills | Status: DC | PRN

## 2019-03-29 NOTE — Discharge Summary (Signed)
Physician Discharge Summary  Patient ID: Angela Glover MRN: [id number] DOB/AGE: Jan 14, 1971 49 y.o.  Admit date: 03/28/2019 Discharge date: 03/29/2019  Admission Diagnoses: fibroid uterus, endometriosis, severe dysmenorrhea  Discharge Diagnoses:  Active Problems:   Status post laparoscopic hysterectomy   Discharged Condition: good  Hospital Course: uncomplicated  Consults: general surgery  Significant Diagnostic Studies: labs:  CBC    Component Value Date/Time   WBC 12.8 (H) 03/28/2019 1605   RBC 4.23 03/28/2019 1605   HGB 11.3 (L) 03/28/2019 1605   HGB 11.9 11/25/2018 1047   HCT 35.6 (L) 03/28/2019 1605   HCT 37.5 11/25/2018 1047   PLT 309 03/28/2019 1605   PLT 376 11/25/2018 1047   MCV 84.2 03/28/2019 1605   MCV 82 11/25/2018 1047   MCH 26.7 03/28/2019 1605   MCHC 31.7 03/28/2019 1605   RDW 11.9 03/28/2019 1605   RDW 12.0 11/25/2018 1047     Treatments: surgery: total laparoscopic hysterectomy, right salpingectomy, left oophorectomy, extensive lysis of adhesions, cystoscopy  Discharge Exam: Blood pressure 114/82, pulse 73, temperature 98.2 F (36.8 C), resp. rate 18, height 5' 4.25" (1.632 m), weight 63.5 kg, last menstrual period 03/20/2019, SpO2 100 %. General appearance: alert, cooperative and no distress Resp: clear to auscultation bilaterally Cardio: S1, S2 normal GI: soft, non-tender; bowel sounds normal; no masses,  no organomegaly Extremities: extremities normal, atraumatic, no cyanosis or edema Incision/Wound: clean, dry and intact without erythema  Disposition: Discharge disposition: 01-Home or Self Care       Discharge Instructions    Call MD for:   Complete by: As directed    Heavy vaginal bleeding   Call MD for:  difficulty breathing, headache or visual disturbances   Complete by: As directed    Call MD for:  extreme fatigue   Complete by: As directed    Call MD for:  hives   Complete by: As directed    Call MD for:   persistant dizziness or light-headedness   Complete by: As directed    Call MD for:  persistant nausea and vomiting   Complete by: As directed    Call MD for:  redness, tenderness, or signs of infection (pain, swelling, redness, odor or green/yellow discharge around incision site)   Complete by: As directed    Call MD for:  severe uncontrolled pain   Complete by: As directed    Call MD for:  temperature >100.4   Complete by: As directed    Diet general   Complete by: As directed    Driving Restrictions   Complete by: As directed    No driving while taking narcotics or until you can slam on the brakes of your care   Increase activity slowly   Complete by: As directed    No dressing needed   Complete by: As directed    Sexual Activity Restrictions   Complete by: As directed    No intercourse x 12 weeks     Allergies as of 03/29/2019      Reactions   Latex Rash   Burning      Medication List    STOP taking these medications   drospirenone-ethinyl estradiol 3-0.02 MG tablet Commonly known as: YAZ     TAKE these medications   acetaminophen 500 MG tablet Commonly known as: TYLENOL Take 2 tablets (1,000 mg total) by mouth every 6 (six) hours.   albuterol 108 (90 Base) MCG/ACT inhaler Commonly known as: VENTOLIN HFA Inhale 2 puffs into the lungs  every 6 (six) hours as needed for wheezing or shortness of breath.   cholecalciferol 25 MCG (1000 UNIT) tablet Commonly known as: VITAMIN D3 Take 1,000 Units by mouth daily.   docusate sodium 100 MG capsule Commonly known as: COLACE Take 1 capsule (100 mg total) by mouth 2 (two) times daily.   ibuprofen 800 MG tablet Commonly known as: ADVIL Take 1 tablet (800 mg total) by mouth every 8 (eight) hours as needed.   magnesium oxide 400 MG tablet Commonly known as: MAG-OX Take 400 mg by mouth daily.   multivitamin with minerals Tabs tablet Take 1 tablet by mouth daily.   oxyCODONE 5 MG immediate release tablet Commonly  known as: Oxy IR/ROXICODONE Take 1 tablet (5 mg total) by mouth every 4 (four) hours as needed for up to 7 days for moderate pain.   triamcinolone cream 0.1 % Commonly known as: KENALOG Apply 1 application topically daily as needed (itching).        Signed: Salvadore Dom 03/29/2019, 7:48 AM

## 2019-03-29 NOTE — Discharge Instructions (Signed)

## 2019-03-30 ENCOUNTER — Telehealth: Payer: Self-pay | Admitting: Obstetrics and Gynecology

## 2019-03-30 NOTE — Telephone Encounter (Signed)
Called the patient, reviewed pathology report with the patient. She is doing well, voiding well, had a BM this morning. Pain is controlled with medication.

## 2019-04-04 ENCOUNTER — Other Ambulatory Visit: Payer: Self-pay

## 2019-04-04 ENCOUNTER — Ambulatory Visit (INDEPENDENT_AMBULATORY_CARE_PROVIDER_SITE_OTHER): Payer: BC Managed Care – PPO | Admitting: Obstetrics and Gynecology

## 2019-04-04 ENCOUNTER — Encounter: Payer: Self-pay | Admitting: Obstetrics and Gynecology

## 2019-04-04 VITALS — BP 118/62 | HR 82 | Temp 97.9°F | Ht 64.25 in | Wt 139.0 lb

## 2019-04-04 DIAGNOSIS — Z90710 Acquired absence of both cervix and uterus: Secondary | ICD-10-CM

## 2019-04-04 NOTE — Progress Notes (Signed)
GYNECOLOGY  VISIT   HPI: 49 y.o.   Married Black or [place] American Not Hispanic or Latino  female   [phone] with Patient's last menstrual period was 03/20/2019 (approximate).   here for one week post opt from total laparoscopic hysterectomy with right salpingectomy/ left ovarian cystectomy with left oophorectomy. Patient states that she is feeling well. She states she felt immediate relief and that her anxiety has gone down and her head itching is much better.  She states that she has been taking the ibuprofen and tylenol and that has taken care of her pain.  She states that they moved this weekend to their apartment.   Normal bowel and bladder function. Minimal spotting Saturday.   GYNECOLOGIC HISTORY: Patient's last menstrual period was 03/20/2019 (approximate). Contraception:none  Menopausal hormone therapy: none         OB History    Gravida  2   Para  0   Term  0   Preterm  0   AB  2   Living  0     SAB  1   TAB      Ectopic  1   Multiple      Live Births                 Patient Active Problem List   Diagnosis Date Noted  . Status post laparoscopic hysterectomy 03/28/2019  . Asthma 03/25/2017  . Knee pain 10/16/2016  . Low back pain 10/16/2016    Past Medical History:  Diagnosis Date  . Abnormal Pap smear of cervix   . Anemia   . Anxiety   . Asthma    exercise induced  . Endometriosis   . Headache    migraines-occassionally when does not have glasses on  . Infertility, female     Past Surgical History:  Procedure Laterality Date  . CYSTOSCOPY N/A 03/28/2019   Procedure: CYSTOSCOPY;  Surgeon: Salvadore Dom, MD;  Location: [facility];  Service: Gynecology;  Laterality: N/A;  . SALPINGECTOMY     left due to ectopic, laparotomy. Endometriosis noted.   Marland Kitchen TOTAL LAPAROSCOPIC HYSTERECTOMY WITH SALPINGECTOMY Bilateral 03/28/2019   Procedure: TOTAL LAPAROSCOPIC HYSTERECTOMY WITH RIGHT SALPINGECTOMY/LEFT OVARIAN CYSTECTOMY WITH LEFT  OOPHERECTOMY;  Surgeon: Salvadore Dom, MD;  Location: [facility];  Service: Gynecology;  Laterality: Bilateral;  LEFT OVARIAN CYSTECTOMY WITH POSSIBLE LEFT OOPHERECTOMY/EXTENDED RECOVERY BED/3 hour surgery time/ks    Current Outpatient Medications  Medication Sig Dispense Refill  . acetaminophen (TYLENOL) 500 MG tablet Take 2 tablets (1,000 mg total) by mouth every 6 (six) hours. 30 tablet 0  . albuterol (VENTOLIN HFA) 108 (90 Base) MCG/ACT inhaler Inhale 2 puffs into the lungs every 6 (six) hours as needed for wheezing or shortness of breath.    . cholecalciferol (VITAMIN D3) 25 MCG (1000 UNIT) tablet Take 1,000 Units by mouth daily.    Marland Kitchen docusate sodium (COLACE) 100 MG capsule Take 1 capsule (100 mg total) by mouth 2 (two) times daily. 30 capsule 0  . ibuprofen (ADVIL) 800 MG tablet Take 1 tablet (800 mg total) by mouth every 8 (eight) hours as needed. 30 tablet 1  . magnesium oxide (MAG-OX) 400 MG tablet Take 400 mg by mouth daily.    . Multiple Vitamin (MULTIVITAMIN WITH MINERALS) TABS tablet Take 1 tablet by mouth daily.    Marland Kitchen oxyCODONE (OXY IR/ROXICODONE) 5 MG immediate release tablet Take 1 tablet (5 mg total) by mouth every 4 (four) hours as  needed for up to 7 days for moderate pain. 30 tablet 0  . triamcinolone cream (KENALOG) 0.1 % Apply 1 application topically daily as needed (itching).     No current facility-administered medications for this visit.     ALLERGIES: Latex  Family History  Problem Relation Age of Onset  . Cancer Maternal Aunt        colon  . Breast cancer Maternal Aunt        does not know age  . Cancer Maternal Grandmother        stomach  . Breast cancer Paternal Aunt     Social History   Socioeconomic History  . Marital status: Married    Spouse name: Not on file  . Number of children: Not on file  . Years of education: Not on file  . Highest education level: Not on file  Occupational History  . Not on file  Tobacco Use  .  Smoking status: Never Smoker  . Smokeless tobacco: Never Used  Substance and Sexual Activity  . Alcohol use: No  . Drug use: No  . Sexual activity: Yes    Partners: Male    Birth control/protection: Pill  Other Topics Concern  . Not on file  Social History Narrative  . Not on file   Social Determinants of Health   Financial Resource Strain:   . Difficulty of Paying Living Expenses: Not on file  Food Insecurity:   . Worried About Charity fundraiser in the Last Year: Not on file  . Ran Out of Food in the Last Year: Not on file  Transportation Needs:   . Lack of Transportation (Medical): Not on file  . Lack of Transportation (Non-Medical): Not on file  Physical Activity:   . Days of Exercise per Week: Not on file  . Minutes of Exercise per Session: Not on file  Stress:   . Feeling of Stress : Not on file  Social Connections:   . Frequency of Communication with Friends and Family: Not on file  . Frequency of Social Gatherings with Friends and Family: Not on file  . Attends Religious Services: Not on file  . Active Member of Clubs or Organizations: Not on file  . Attends Archivist Meetings: Not on file  . Marital Status: Not on file  Intimate Partner Violence:   . Fear of Current or Ex-Partner: Not on file  . Emotionally Abused: Not on file  . Physically Abused: Not on file  . Sexually Abused: Not on file    Review of Systems  All other systems reviewed and are negative.   PHYSICAL EXAMINATION:    LMP 03/20/2019 (Approximate)     General appearance: alert, cooperative and appears stated age Abdomen: soft, non-tender; non distended, no masses,  no organomegaly Incisions: clean, dry and intact without erythema or induration   ASSESSMENT 1 week post op s/p TLH/LO/RS/LOA/cystoscopy. Pathology benign, +endometriosis. She is doing very well    PLAN F/U in 3 weeks Call with any concerns

## 2019-04-07 ENCOUNTER — Encounter: Payer: Self-pay | Admitting: Obstetrics and Gynecology

## 2019-04-08 ENCOUNTER — Telehealth: Payer: Self-pay | Admitting: Obstetrics and Gynecology

## 2019-04-08 NOTE — Telephone Encounter (Addendum)
Spoke to pt. Pt calling to states that she has been vomiting with diarrhea since eating dinner on Tuesday night. Noticed having dark blood in stool, but not seen any since Tuesday. Pt had surgery with Dr Talbert Nan on 03/28/2019. Pt just wanted to make sure nothing came loose. Pt states having abd cramps and pain from NVD. Pt assured that nothing should have come loose since 11 days post op.  Pt advised to been seen at ER or Urgent care for dehydration and post op check since not being able to eat or drink and still have slight abd pain Pt agreeable.   Routing to Dr Talbert Nan for review and will close encounter.

## 2019-04-08 NOTE — Telephone Encounter (Signed)
Patient says she has diarrhea and has been vomiting.

## 2019-04-08 NOTE — Telephone Encounter (Signed)
Patient sent the following correspondence through [facility].  Good afternoon,  After eating a sloppy sandwich for dinner Tuesday night, I've had alot nausea and diarrhea and vomiting.   I cant eat anything and the smell of food makes me sick to my stomach.  Wednesday morning, when I went tonyhe restroom some dark blood was in my in stool, but not spotting sense.  I haven't eaten in 2 days.  My stomach is very sore and I can't take any pain meds.  What can I do?

## 2019-04-09 ENCOUNTER — Encounter: Payer: Self-pay | Admitting: Emergency Medicine

## 2019-04-09 ENCOUNTER — Ambulatory Visit (INDEPENDENT_AMBULATORY_CARE_PROVIDER_SITE_OTHER): Payer: BC Managed Care – PPO

## 2019-04-09 ENCOUNTER — Ambulatory Visit
Admission: EM | Admit: 2019-04-09 | Discharge: 2019-04-09 | Disposition: A | Discharge status: Other Acute Inpatient Hospital | Payer: BC Managed Care – PPO | Source: Home / Self Care

## 2019-04-09 ENCOUNTER — Inpatient Hospital Stay (HOSPITAL_COMMUNITY): Payer: BC Managed Care – PPO

## 2019-04-09 ENCOUNTER — Other Ambulatory Visit: Payer: Self-pay

## 2019-04-09 ENCOUNTER — Inpatient Hospital Stay (HOSPITAL_COMMUNITY)
Admission: EM | Admit: 2019-04-09 | Discharge: 2019-04-13 | DRG: 390 | Disposition: A | Discharge status: Home / Self Care | Payer: BC Managed Care – PPO | Attending: Obstetrics and Gynecology | Admitting: Obstetrics and Gynecology

## 2019-04-09 ENCOUNTER — Encounter (HOSPITAL_COMMUNITY): Payer: Self-pay | Admitting: Emergency Medicine

## 2019-04-09 ENCOUNTER — Emergency Department (HOSPITAL_COMMUNITY): Payer: BC Managed Care – PPO

## 2019-04-09 DIAGNOSIS — R109 Unspecified abdominal pain: Secondary | ICD-10-CM | POA: Diagnosis not present

## 2019-04-09 DIAGNOSIS — Z79899 Other long term (current) drug therapy: Secondary | ICD-10-CM

## 2019-04-09 DIAGNOSIS — Z803 Family history of malignant neoplasm of breast: Secondary | ICD-10-CM

## 2019-04-09 DIAGNOSIS — Z90710 Acquired absence of both cervix and uterus: Secondary | ICD-10-CM

## 2019-04-09 DIAGNOSIS — K9131 Postprocedural partial intestinal obstruction: Principal | ICD-10-CM | POA: Diagnosis present

## 2019-04-09 DIAGNOSIS — Z91040 Latex allergy status: Secondary | ICD-10-CM

## 2019-04-09 DIAGNOSIS — K5669 Other partial intestinal obstruction: Secondary | ICD-10-CM | POA: Diagnosis not present

## 2019-04-09 DIAGNOSIS — Z8742 Personal history of other diseases of the female genital tract: Secondary | ICD-10-CM

## 2019-04-09 DIAGNOSIS — Z90721 Acquired absence of ovaries, unilateral: Secondary | ICD-10-CM | POA: Diagnosis not present

## 2019-04-09 DIAGNOSIS — K567 Ileus, unspecified: Secondary | ICD-10-CM

## 2019-04-09 DIAGNOSIS — Y836 Removal of other organ (partial) (total) as the cause of abnormal reaction of the patient, or of later complication, without mention of misadventure at the time of the procedure: Secondary | ICD-10-CM | POA: Diagnosis present

## 2019-04-09 DIAGNOSIS — Z885 Allergy status to narcotic agent status: Secondary | ICD-10-CM

## 2019-04-09 DIAGNOSIS — R1110 Vomiting, unspecified: Secondary | ICD-10-CM | POA: Diagnosis not present

## 2019-04-09 DIAGNOSIS — K913 Postprocedural intestinal obstruction, unspecified as to partial versus complete: Secondary | ICD-10-CM

## 2019-04-09 DIAGNOSIS — Z4682 Encounter for fitting and adjustment of non-vascular catheter: Secondary | ICD-10-CM | POA: Diagnosis not present

## 2019-04-09 DIAGNOSIS — R1013 Epigastric pain: Secondary | ICD-10-CM

## 2019-04-09 DIAGNOSIS — K56609 Unspecified intestinal obstruction, unspecified as to partial versus complete obstruction: Secondary | ICD-10-CM

## 2019-04-09 DIAGNOSIS — K5989 Other specified functional intestinal disorders: Secondary | ICD-10-CM | POA: Diagnosis not present

## 2019-04-09 DIAGNOSIS — Z20822 Contact with and (suspected) exposure to covid-19: Secondary | ICD-10-CM | POA: Diagnosis present

## 2019-04-09 DIAGNOSIS — Z9079 Acquired absence of other genital organ(s): Secondary | ICD-10-CM

## 2019-04-09 DIAGNOSIS — Z800 Family history of malignant neoplasm of digestive organs: Secondary | ICD-10-CM

## 2019-04-09 DIAGNOSIS — K5900 Constipation, unspecified: Secondary | ICD-10-CM | POA: Diagnosis present

## 2019-04-09 DIAGNOSIS — R1909 Other intra-abdominal and pelvic swelling, mass and lump: Secondary | ICD-10-CM | POA: Diagnosis not present

## 2019-04-09 DIAGNOSIS — K56699 Other intestinal obstruction unspecified as to partial versus complete obstruction: Secondary | ICD-10-CM | POA: Diagnosis not present

## 2019-04-09 DIAGNOSIS — J45990 Exercise induced bronchospasm: Secondary | ICD-10-CM | POA: Diagnosis present

## 2019-04-09 DIAGNOSIS — Z0189 Encounter for other specified special examinations: Secondary | ICD-10-CM

## 2019-04-09 DIAGNOSIS — R112 Nausea with vomiting, unspecified: Secondary | ICD-10-CM | POA: Diagnosis not present

## 2019-04-09 LAB — COMPREHENSIVE METABOLIC PANEL
ALT: 41 U/L (ref 0–44)
AST: 34 U/L (ref 15–41)
Albumin: 3.8 g/dL (ref 3.5–5.0)
Alkaline Phosphatase: 52 U/L (ref 38–126)
Anion gap: 13 (ref 5–15)
BUN: 8 mg/dL (ref 6–20)
CO2: 25 mmol/L (ref 22–32)
Calcium: 10 mg/dL (ref 8.9–10.3)
Chloride: 101 mmol/L (ref 98–111)
Creatinine, Ser: 0.85 mg/dL (ref 0.44–1.00)
GFR calc Af Amer: >60 mL/min (ref >60)
GFR calc non Af Amer: >60 mL/min (ref >60)
Glucose, Bld: 103 mg/dL — ABNORMAL HIGH (ref 70–99)
Potassium: 3.8 mmol/L (ref 3.5–5.1)
Sodium: 139 mmol/L (ref 135–145)
Total Bilirubin: 0.7 mg/dL (ref 0.3–1.2)
Total Protein: 7.4 g/dL (ref 6.5–8.1)

## 2019-04-09 LAB — URINALYSIS, ROUTINE W REFLEX MICROSCOPIC
Bacteria, UA: NONE SEEN
Bilirubin Urine: NEGATIVE
Glucose, UA: NEGATIVE mg/dL
Ketones, ur: NEGATIVE mg/dL
Nitrite: NEGATIVE
Protein, ur: 30 mg/dL — AB
Specific Gravity, Urine: 1.021 (ref 1.005–1.030)
pH: 5 (ref 5.0–8.0)

## 2019-04-09 LAB — CBC
HCT: 37.9 % (ref 36.0–46.0)
Hemoglobin: 12.3 g/dL (ref 12.0–15.0)
MCH: 26.3 pg (ref 26.0–34.0)
MCHC: 32.5 g/dL (ref 30.0–36.0)
MCV: 81.2 fL (ref 80.0–100.0)
Platelets: 429 10*3/uL — ABNORMAL HIGH (ref 150–400)
RBC: 4.67 MIL/uL (ref 3.87–5.11)
RDW: 11.5 % (ref 11.5–15.5)
WBC: 6.5 10*3/uL (ref 4.0–10.5)
nRBC: 0 % (ref 0.0–0.2)

## 2019-04-09 LAB — LIPASE, BLOOD: Lipase: 32 U/L (ref 11–51)

## 2019-04-09 LAB — RESPIRATORY PANEL BY RT PCR (FLU A&B, COVID)
Influenza A by PCR: NEGATIVE
Influenza B by PCR: NEGATIVE
SARS Coronavirus 2 by RT PCR: NEGATIVE

## 2019-04-09 LAB — ACETAMINOPHEN LEVEL: Acetaminophen (Tylenol), Serum: <10 ug/mL — ABNORMAL LOW (ref 10–30)

## 2019-04-09 LAB — SALICYLATE LEVEL: Salicylate Lvl: <7 mg/dL — ABNORMAL LOW (ref 7.0–30.0)

## 2019-04-09 MED ORDER — HYDROMORPHONE HCL 1 MG/ML IJ SOLN
0.2000 mg | INTRAMUSCULAR | Status: DC | PRN
  Administered 2019-04-09 – 2019-04-10 (×3): 0.5 mg via INTRAVENOUS
  Filled 2019-04-09 (×3): qty 1

## 2019-04-09 MED ORDER — ONDANSETRON HCL 4 MG/2ML IJ SOLN: 4.0000 mg | Freq: Four times a day (QID) | INTRAMUSCULAR | Status: DC | PRN

## 2019-04-09 MED ORDER — KETOROLAC TROMETHAMINE 15 MG/ML IJ SOLN
15.0000 mg | Freq: Four times a day (QID) | INTRAMUSCULAR | Status: DC | PRN
  Administered 2019-04-09 – 2019-04-10 (×3): 15 mg via INTRAVENOUS
  Filled 2019-04-09 (×3): qty 1

## 2019-04-09 MED ORDER — IOHEXOL 300 MG/ML  SOLN
100.0000 mL | Freq: Once | INTRAMUSCULAR | Status: AC | PRN
  Administered 2019-04-09: 13:00:00 100 mL via INTRAVENOUS

## 2019-04-09 MED ORDER — SODIUM CHLORIDE 0.9% FLUSH
3.0000 mL | Freq: Once | INTRAVENOUS | Status: AC
  Administered 2019-04-09: 13:00:00 3 mL via INTRAVENOUS

## 2019-04-09 MED ORDER — ALUM & MAG HYDROXIDE-SIMETH 200-200-20 MG/5ML PO SUSP
30.0000 mL | Freq: Once | ORAL | Status: AC
  Administered 2019-04-09: 09:00:00 30 mL via ORAL

## 2019-04-09 MED ORDER — WHITE PETROLATUM EX OINT
TOPICAL_OINTMENT | CUTANEOUS | Status: AC
  Administered 2019-04-09: 0.2
  Filled 2019-04-09: qty 28.35

## 2019-04-09 MED ORDER — FENTANYL CITRATE (PF) 100 MCG/2ML IJ SOLN
25.0000 ug | Freq: Once | INTRAMUSCULAR | Status: AC
  Administered 2019-04-09: 14:00:00 25 ug via INTRAVENOUS
  Filled 2019-04-09: qty 2

## 2019-04-09 MED ORDER — LIDOCAINE VISCOUS HCL 2 % MT SOLN
15.0000 mL | Freq: Once | OROMUCOSAL | Status: AC
  Administered 2019-04-09: 15 mL via ORAL

## 2019-04-09 MED ORDER — ACETAMINOPHEN 325 MG PO TABS: 650.0000 mg | ORAL_TABLET | ORAL | Status: DC | PRN

## 2019-04-09 MED ORDER — LACTATED RINGERS IV SOLN: INTRAVENOUS | Status: DC

## 2019-04-09 MED ORDER — DIATRIZOATE MEGLUMINE & SODIUM 66-10 % PO SOLN
90.0000 mL | Freq: Once | ORAL | Status: AC
  Administered 2019-04-09: 90 mL via NASOGASTRIC
  Filled 2019-04-09 (×2): qty 90

## 2019-04-09 MED ORDER — PHENOL 1.4 % MT LIQD
1.0000 | OROMUCOSAL | Status: DC | PRN
  Administered 2019-04-09: 1 via OROMUCOSAL
  Filled 2019-04-09: qty 177

## 2019-04-09 NOTE — ED Notes (Signed)
Pt able to walk to the restroom without assistance or assistive device. Denies dizziness

## 2019-04-09 NOTE — ED Notes (Signed)
Patient is being discharged from the Urgent [facility] and sent to the Emergency Department via POV. Per AY, patient is stable but in need of higher level of care due to SBO. Patient is aware and verbalizes understanding of plan of care.  Vitals:   04/09/19 0815  BP: 126/81  Pulse: 92  Resp: 18  Temp: 98.5 F (36.9 C)  SpO2: 99%

## 2019-04-09 NOTE — ED Notes (Signed)
NG tube placement attempted x2. Pt was able to tolerate placement to measured length on second attempt but was not able to tolerate tube remaining in place, requested RN remove instead of fasten. Upon removal, epistaxis noted to left nare, easily controlled. Pt requests break from procedure at this time.

## 2019-04-09 NOTE — ED Provider Notes (Signed)
[facility] EMERGENCY DEPARTMENT Provider Note   CSN: [id number] Arrival date & time: 04/09/19  1043     History Chief Complaint  Patient presents with  . Abdominal Pain    Angela Glover is a 49 y.o. female with a past medical history of anemia, endometriosis, status post laparoscopic hysterectomy on 03/28/2019 presenting to the ED from urgent care for concerns for small bowel obstruction.  The week following her surgery her pain has significantly improved with taking 1000 mg acetaminophen every 4 hours and 800 mg of ibuprofen every 8 hours.  On 04/05/2019 had a meal that she made which was sloppy Joe's.  For the next 2 days she was having several episodes of nonbloody, nonbilious emesis and diarrhea although her husband who ate the same meal was asymptomatic.  She initially thought it was due to food poisoning.  She took 2 doses of Pepto-Bismol with no significant improvement in her symptoms.  Her last bowel movement was on 04/07/2019 and has since not had any bowel movements or passing gas.  She has not vomited since then either.  She took 1 additional dose of ibuprofen yesterday without significant improvement in her pain.  She has been belching but feels like she cannot pass gas.  States that the pain is sharp in the epigastric area and does not radiate.  Reports pressure with urination but denies dysuria.  Denies fever, shortness of breath, sick contacts with similar symptoms, back pain.  HPI     Past Medical History:  Diagnosis Date  . Abnormal Pap smear of cervix   . Anemia   . Anxiety   . Asthma    exercise induced  . Endometriosis   . Headache    migraines-occassionally when does not have glasses on  . Infertility, female     Patient Active Problem List   Diagnosis Date Noted  . Status post laparoscopic hysterectomy 03/28/2019  . Asthma 03/25/2017  . Knee pain 10/16/2016  . Low back pain 10/16/2016    Past Surgical History:  Procedure  Laterality Date  . CYSTOSCOPY N/A 03/28/2019   Procedure: CYSTOSCOPY;  Surgeon: Salvadore Dom, MD;  Location: [facility];  Service: Gynecology;  Laterality: N/A;  . SALPINGECTOMY     left due to ectopic, laparotomy. Endometriosis noted.   Marland Kitchen TOTAL LAPAROSCOPIC HYSTERECTOMY WITH SALPINGECTOMY Bilateral 03/28/2019   Procedure: TOTAL LAPAROSCOPIC HYSTERECTOMY WITH RIGHT SALPINGECTOMY/LEFT OVARIAN CYSTECTOMY WITH LEFT OOPHERECTOMY;  Surgeon: Salvadore Dom, MD;  Location: [facility];  Service: Gynecology;  Laterality: Bilateral;  LEFT OVARIAN CYSTECTOMY WITH POSSIBLE LEFT OOPHERECTOMY/EXTENDED RECOVERY BED/3 hour surgery time/ks     OB History    Gravida  2   Para  0   Term  0   Preterm  0   AB  2   Living  0     SAB  1   TAB      Ectopic  1   Multiple      Live Births              Family History  Problem Relation Age of Onset  . Cancer Maternal Aunt        colon  . Breast cancer Maternal Aunt        does not know age  . Cancer Maternal Grandmother        stomach  . Breast cancer Paternal Aunt     Social History   Tobacco Use  . Smoking  status: Never Smoker  . Smokeless tobacco: Never Used  Substance Use Topics  . Alcohol use: No  . Drug use: No    Home Medications Prior to Admission medications   Medication Sig Start Date End Date Taking? Authorizing Provider  acetaminophen (TYLENOL) 500 MG tablet Take 2 tablets (1,000 mg total) by mouth every 6 (six) hours. 03/29/19  Yes Salvadore Dom, MD  albuterol (VENTOLIN HFA) 108 (90 Base) MCG/ACT inhaler Inhale 2 puffs into the lungs every 6 (six) hours as needed for wheezing or shortness of breath.   Yes [provider]  cholecalciferol (VITAMIN D3) 25 MCG (1000 UNIT) tablet Take 1,000 Units by mouth daily.   Yes [provider]  docusate sodium (COLACE) 100 MG capsule Take 1 capsule (100 mg total) by mouth 2 (two) times daily. 03/29/19  Yes Salvadore Dom, MD  ibuprofen (ADVIL) 800 MG tablet Take 1 tablet (800 mg total) by mouth every 8 (eight) hours as needed. 03/29/19  Yes Salvadore Dom, MD  magnesium oxide (MAG-OX) 400 MG tablet Take 400 mg by mouth daily.   Yes [provider]  Multiple Vitamin (MULTIVITAMIN WITH MINERALS) TABS tablet Take 1 tablet by mouth daily.   Yes [provider]  triamcinolone cream (KENALOG) 0.1 % Apply 1 application topically daily as needed (itching).   Yes [provider]    Allergies    Oxycodone and Latex  Review of Systems   Review of Systems  Constitutional: Negative for appetite change, chills and fever.  HENT: Negative for ear pain, rhinorrhea, sneezing and sore throat.   Eyes: Negative for photophobia and visual disturbance.  Respiratory: Negative for cough, chest tightness, shortness of breath and wheezing.   Cardiovascular: Negative for chest pain and palpitations.  Gastrointestinal: Positive for abdominal pain, diarrhea (resolved), nausea and vomiting (resolved). Negative for blood in stool and constipation.  Genitourinary: Negative for dysuria, hematuria and urgency.  Musculoskeletal: Negative for myalgias.  Skin: Negative for rash.  Neurological: Negative for dizziness, weakness and light-headedness.    Physical Exam Updated Vital Signs BP (!) 124/92   Pulse 83   Temp 98.4 F (36.9 C)   Resp 20   LMP 03/20/2019 (Approximate)   SpO2 98%   Physical Exam Vitals and nursing note reviewed.  Constitutional:      General: She is not in acute distress.    Appearance: She is well-developed.  HENT:     Head: Normocephalic and atraumatic.     Nose: Nose normal.  Eyes:     General: No scleral icterus.       Left eye: No discharge.     Conjunctiva/sclera: Conjunctivae normal.  Cardiovascular:     Rate and Rhythm: Normal rate and regular rhythm.     Heart sounds: Normal heart sounds. No murmur. No friction rub. No gallop.   Pulmonary:      Effort: Pulmonary effort is normal. No respiratory distress.     Breath sounds: Normal breath sounds.  Abdominal:     General: Bowel sounds are normal. There is no distension.     Palpations: Abdomen is soft.     Tenderness: There is abdominal tenderness in the epigastric area. There is no guarding.  Musculoskeletal:        General: Normal range of motion.     Cervical back: Normal range of motion and neck supple.  Skin:    General: Skin is warm and dry.     Findings: No rash.  Neurological:  Mental Status: She is alert.     Motor: No abnormal muscle tone.     Coordination: Coordination normal.     ED Results / Procedures / Treatments   Labs (all labs ordered are listed, but only abnormal results are displayed) Labs Reviewed  COMPREHENSIVE METABOLIC PANEL - Abnormal; Notable for the following components:      Result Value   Glucose, Bld 103 (*)    All other components within normal limits  CBC - Abnormal; Notable for the following components:   Platelets 429 (*)    All other components within normal limits  URINALYSIS, ROUTINE W REFLEX MICROSCOPIC - Abnormal; Notable for the following components:   APPearance HAZY (*)    Hgb urine dipstick MODERATE (*)    Protein, ur 30 (*)    Leukocytes,Ua SMALL (*)    All other components within normal limits  SALICYLATE LEVEL - Abnormal; Notable for the following components:   Salicylate Lvl [id number] (*)    All other components within normal limits  ACETAMINOPHEN LEVEL - Abnormal; Notable for the following components:   Acetaminophen (Tylenol), Serum <10 (*)    All other components within normal limits  RESPIRATORY PANEL BY RT PCR (FLU A&B, COVID)  LIPASE, BLOOD    EKG None  Radiology CT ABDOMEN PELVIS W CONTRAST  Result Date: 04/09/2019 CLINICAL DATA:  Hysterectomy 03/28/2019 recent onset nausea, vomiting and diarrhea past 3 days now with abdominal pain and bloating. EXAM: CT ABDOMEN AND PELVIS WITH CONTRAST TECHNIQUE:  Multidetector CT imaging of the abdomen and pelvis was performed using the standard protocol following bolus administration of intravenous contrast. CONTRAST:  122mL OMNIPAQUE IOHEXOL 300 MG/ML  SOLN COMPARISON:  None. FINDINGS: Lower chest: Lung bases are clear. Hepatobiliary: Evidence of moderate cholelithiasis with multiple low-density stones present. Liver and biliary tree are normal. Pancreas: Normal. Spleen: Normal. Adrenals/Urinary Tract: Adrenal glands are normal. Kidneys are normal in size without hydronephrosis or nephrolithiasis. Ureters and bladder are normal. Stomach/Bowel: Stomach is normal. Appendix is normal. Colon is decompressed. There multiple air and fluid-filled dilated small bowel loops measuring up to 3.4 cm in diameter. There are several areas of narrowing of small bowel and small bowel wall thickening over the lower abdomen/pelvis likely the cause of this small bowel obstruction and likely due to adhesions. There is no free peritoneal air. No evidence of pneumatosis. Mild-to-moderate associated free fluid in the pelvis. Vascular/Lymphatic: Normal. Reproductive: Evidence of patient's recent hysterectomy. Ovaries not definitely visualized. There is a well-defined round 3.6 cm cystic structure in the right pelvis. This could represent an abscess versus peritoneal inclusion cyst or ovarian remnant cyst. Other: None. Musculoskeletal: No acute findings. IMPRESSION: 1. Evidence of distal small bowel obstruction with several areas of small-bowel wall thickening and small bowel narrowing over the lower abdomen/pelvis likely the etiology of the obstruction and due to adhesions. Mild-to-moderate associated free pelvic fluid. 2. 3.6 cm round cystic structure over the right pelvis which may be due to postsurgical abscess, peritoneal inclusion cyst or ovarian cyst/ovarian remnant. Post hysterectomy. 3.  Cholelithiasis. Electronically Signed   By: Marin Olp M.D.   On: 04/09/2019 13:14   DG Abd Acute  W/Chest  Result Date: 04/09/2019 CLINICAL DATA:  Abdominal pain.  Hysterectomy 03/28/2019 EXAM: DG ABDOMEN ACUTE W/ 1V CHEST COMPARISON:  None. FINDINGS: Normal heart size and mediastinal contours. No acute infiltrate or edema. No effusion or pneumothorax. No acute osseous findings. Dilated small bowel out of proportion to colon, with multiple fluid levels. No evidence  of pneumoperitoneum. IMPRESSION: Diffuse small bowel dilatation with fluid levels. Although postoperative history suggests ileus there is no colonic distension and the pattern is more suggestive of small bowel obstruction. Suggest abdominal CT. Electronically Signed   By: Monte Fantasia M.D.   On: 04/09/2019 09:44    Procedures .Critical Care Performed by: Delia Heady, PA-C Authorized by: Delia Heady, PA-C   Critical care provider statement:    Critical care time (minutes):  35   Critical care was necessary to treat or prevent imminent or life-threatening deterioration of the following conditions:  Circulatory failure, respiratory failure, CNS failure or compromise, dehydration and metabolic crisis   Critical care was time spent personally by me on the following activities:  Development of treatment plan with patient or surrogate, discussions with consultants, evaluation of patient's response to treatment, examination of patient, ordering and performing treatments and interventions, ordering and review of laboratory studies, ordering and review of radiographic studies, re-evaluation of patient's condition, pulse oximetry and review of old charts   I assumed direction of critical care for this patient from another provider in my specialty: no     (including critical care time)  Medications Ordered in ED Medications  sodium chloride flush (NS) 0.9 % injection 3 mL (3 mLs Intravenous Given 04/09/19 1255)  iohexol (OMNIPAQUE) 300 MG/ML solution 100 mL (100 mLs Intravenous Contrast Given 04/09/19 1231)  fentaNYL (SUBLIMAZE) injection  25 mcg (25 mcg Intravenous Given 04/09/19 1406)    ED Course  I have reviewed the triage vital signs and the nursing notes.  Pertinent labs & imaging results that were available during my care of the patient were reviewed by me and considered in my medical decision making (see chart for details).  Clinical Course as of Apr 09 1455  Sat Apr 09, 2019  1351 Spoke to Dr. Delilah Shan, OB/GYN from [facility].  Believes that the cystic structure found on CT is most likely cystic in nature rather than postsurgical abscess.  Nevertheless recommends monitoring her vital signs and to call him back if general surgery has any other request.   [HK]  1353 Spoke to Dr. Redmond Pulling of general surgery who states that he can consult on the patient but will not be admitting primarily to their service as this is an OB/GYN postoperative complication.  Recommends NG tube which I have ordered.   [HK]  1440 Dr. Redmond Pulling has seen patient at bedside and will place orders as well as consult note.   [HK]    Clinical Course User Index [HK] Delia Heady, PA-C   MDM Rules/Calculators/A&P                      49 year old female who is status post laparoscopic hysterectomy on March 28, 2019 presenting to the ED for concerns for small bowel obstruction.  Since April 07, 2019 has had no bowel movements, worsening epigastric abdominal pain and no flatulence.  She had nausea, vomiting and diarrhea 2 days prior to that after eating a meal at home.  On exam there are 10 palpation of the upper abdomen without rebound or guarding.  Abdomen does not appear distended.  Vital signs are within normal limits, she is not febrile, tachycardic or tachypneic.  Lab work including CBC, CMP, lipase unremarkable.  Salicylate level and Tylenol level are both normal as there was some concern that she was taking more of both of these medications than she was supposed to be last week.  CT scan shows  distal small bowel obstruction as a  result of her adhesions.  There is also a 3.6cm cystic structure in the right lower pelvis concerning for abscess versus cyst. I personally reviewed and interpreted all labwork and imaging studies on today's visit. I consulted both OB/GYN and general surgery and Dr. Delilah Shan of [facility]N [facility] will admit the patient for the small bowel obstruction.   Final Clinical Impression(s) / ED Diagnoses Final diagnoses:  Small bowel obstruction due to postoperative adhesions    Rx / DC Orders ED Discharge Orders    None      Portions of this note were generated with Dragon dictation software. Dictation errors may occur despite best attempts at proofreading.    Delia Heady, PA-C 04/09/19 1458    Fredia Sorrow, MD 04/10/19 1154

## 2019-04-09 NOTE — Consult Note (Signed)
CC: nausea/vomiting/abd pain  Requesting provider: Shelly Coss PA  HPI: Angela Glover is an 48 y.o. female who is here for several day history of nausea, vomiting, diarrhea and abdominal pain.  The patient underwent a total laparoscopic hysterectomy with right salpingectomy, left oophorectomy and extensive lysis of adhesions by Dr. Talbert Nan on February 8.  She states that she was doing well until early Wednesday morning.  On Tuesday evening she had some sloppy Joe's and then around 1 AM on Wednesday she states that she had a bad reaction.  She had multiple episodes of nausea vomiting and diarrhea.  She also had abdominal discomfort mainly in her upper abdomen.  She states that her symptoms continued on Wednesday and Thursday.  She states that everything pretty much tied down on Friday in the sense that she had no nausea or vomiting.  However she started burping and belching.  Her pain got worse so she went to urgent care and she was sent to the emergency room.  She states that her last bowel movement was on Thursday.  No real flatus since then either.  She states that she might of had 1 or 2 small episodes of flatus this morning.  She is burping a lot.  No fever or chills.  She tried Pepto-Bismol without any relief.  She has not had an appetite for the past few days.  Lab work was normal in the emergency room.  She had a CT scan which revealed findings consistent with a partial small bowel obstruction, some fluid in the pelvis and a 3.6 cm structure in the pelvis either fluid collection or cystic structure  Past Medical History:  Diagnosis Date  . Abnormal Pap smear of cervix   . Anemia   . Anxiety   . Asthma    exercise induced  . Endometriosis   . Headache    migraines-occassionally when does not have glasses on  . Infertility, female     Past Surgical History:  Procedure Laterality Date  . CYSTOSCOPY N/A 03/28/2019   Procedure: CYSTOSCOPY;  Surgeon: Salvadore Dom, MD;   Location: [facility];  Service: Gynecology;  Laterality: N/A;  . SALPINGECTOMY     left due to ectopic, laparotomy. Endometriosis noted.   Marland Kitchen TOTAL LAPAROSCOPIC HYSTERECTOMY WITH SALPINGECTOMY Bilateral 03/28/2019   Procedure: TOTAL LAPAROSCOPIC HYSTERECTOMY WITH RIGHT SALPINGECTOMY/LEFT OVARIAN CYSTECTOMY WITH LEFT OOPHERECTOMY;  Surgeon: Salvadore Dom, MD;  Location: [place];  Service: Gynecology;  Laterality: Bilateral;  LEFT OVARIAN CYSTECTOMY WITH POSSIBLE LEFT OOPHERECTOMY/EXTENDED RECOVERY BED/3 hour surgery time/ks    Family History  Problem Relation Age of Onset  . Cancer Maternal Aunt        colon  . Breast cancer Maternal Aunt        does not know age  . Cancer Maternal Grandmother        stomach  . Breast cancer Paternal Aunt     Social:  reports that she has never smoked. She has never used smokeless tobacco. She reports that she does not drink alcohol or use drugs.  Allergies:  Allergies  Allergen Reactions  . Oxycodone Itching  . Latex Rash    Burning    Medications: I have reviewed the patient's current medications.  Results for orders placed or performed during the hospital encounter of 04/09/19 (from the past 48 hour(s))  Lipase, blood     Status: None   Collection Time: 04/09/19 11:01 AM  Result Value Ref Range  Lipase 32 11 - 51 U/L    Comment: Performed at [facility], [place] [address], [place], [place] [address]  Comprehensive metabolic panel     Status: Abnormal   Collection Time: 04/09/19 11:01 AM  Result Value Ref Range   Sodium 139 135 - 145 mmol/L   Potassium 3.8 3.5 - 5.1 mmol/L   Chloride 101 98 - 111 mmol/L   CO2 25 22 - 32 mmol/L   Glucose, Bld 103 (H) 70 - 99 mg/dL   BUN 8 6 - 20 mg/dL   Creatinine, Ser 0.85 0.44 - 1.00 mg/dL   Calcium 10.0 8.9 - 10.3 mg/dL   Total Protein 7.4 6.5 - 8.1 g/dL   Albumin 3.8 3.5 - 5.0 g/dL   AST 34 15 - 41 U/L   ALT 41 0 - 44 U/L   Alkaline Phosphatase 52 38 -  126 U/L   Total Bilirubin 0.7 0.3 - 1.2 mg/dL   GFR calc non Af Amer >60 >60 mL/min   GFR calc Af Amer >60 >60 mL/min   Anion gap 13 5 - 15    Comment: Performed at [facility], [place] [address], [place], [place] [address]  CBC     Status: Abnormal   Collection Time: 04/09/19 11:01 AM  Result Value Ref Range   WBC 6.5 4.0 - 10.5 K/uL   RBC 4.67 3.87 - 5.11 MIL/uL   Hemoglobin 12.3 12.0 - 15.0 g/dL   HCT 37.9 36.0 - 46.0 %   MCV 81.2 80.0 - 100.0 fL   MCH 26.3 26.0 - 34.0 pg   MCHC 32.5 30.0 - 36.0 g/dL   RDW 11.5 11.5 - 15.5 %   Platelets 429 (H) 150 - 400 K/uL   nRBC 0.0 0.0 - 0.2 %    Comment: Performed at [facility], [place] [address] [place], [place] [address]  Urinalysis, Routine w reflex microscopic     Status: Abnormal   Collection Time: 04/09/19 11:15 AM  Result Value Ref Range   Color, Urine YELLOW YELLOW   APPearance HAZY (A) CLEAR   Specific Gravity, Urine 1.021 1.005 - 1.030   pH 5.0 5.0 - 8.0   Glucose, UA NEGATIVE NEGATIVE mg/dL   Hgb urine dipstick MODERATE (A) NEGATIVE   Bilirubin Urine NEGATIVE NEGATIVE   Ketones, ur NEGATIVE NEGATIVE mg/dL   Protein, ur 30 (A) NEGATIVE mg/dL   Nitrite NEGATIVE NEGATIVE   Leukocytes,Ua SMALL (A) NEGATIVE   RBC / HPF 0-5 0 - 5 RBC/hpf   WBC, UA 11-20 0 - 5 WBC/hpf   Bacteria, UA NONE SEEN NONE SEEN   Squamous Epithelial / LPF 11-20 0 - 5   Mucus PRESENT    Hyaline Casts, UA PRESENT     Comment: Performed at [facility], [place] [address] [place], [place] [id number]  Salicylate level     Status: Abnormal   Collection Time: 04/09/19 11:43 AM  Result Value Ref Range   Salicylate Lvl Q000111Q (L) 7.0 - 30.0 mg/dL    Comment: Performed at [place] [address], [place], [place] [address]  Acetaminophen level     Status: Abnormal   Collection Time: 04/09/19 11:43 AM  Result Value Ref Range   Acetaminophen (Tylenol), Serum <10 (L) 10 - 30 ug/mL    Comment: (NOTE) Therapeutic concentrations vary  significantly. A range of 10-30 ug/mL  may be an effective concentration for many patients. However, some  are best  treated at concentrations outside of this range. Acetaminophen concentrations >150 ug/mL at 4 hours after ingestion  and >50 ug/mL at 12 hours after ingestion are often associated with  toxic reactions. Performed at [facility], [place] [address] [place], [place] [address]     CT ABDOMEN PELVIS W CONTRAST  Result Date: 04/09/2019 CLINICAL DATA:  Hysterectomy 03/28/2019 recent onset nausea, vomiting and diarrhea past 3 days now with abdominal pain and bloating. EXAM: CT ABDOMEN AND PELVIS WITH CONTRAST TECHNIQUE: Multidetector CT imaging of the abdomen and pelvis was performed using the standard protocol following bolus administration of intravenous contrast. CONTRAST:  162mL OMNIPAQUE IOHEXOL 300 MG/ML  SOLN COMPARISON:  None. FINDINGS: Lower chest: Lung bases are clear. Hepatobiliary: Evidence of moderate cholelithiasis with multiple low-density stones present. Liver and biliary tree are normal. Pancreas: Normal. Spleen: Normal. Adrenals/Urinary Tract: Adrenal glands are normal. Kidneys are normal in size without hydronephrosis or nephrolithiasis. Ureters and bladder are normal. Stomach/Bowel: Stomach is normal. Appendix is normal. Colon is decompressed. There multiple air and fluid-filled dilated small bowel loops measuring up to 3.4 cm in diameter. There are several areas of narrowing of small bowel and small bowel wall thickening over the lower abdomen/pelvis likely the cause of this small bowel obstruction and likely due to adhesions. There is no free peritoneal air. No evidence of pneumatosis. Mild-to-moderate associated free fluid in the pelvis. Vascular/Lymphatic: Normal. Reproductive: Evidence of patient's recent hysterectomy. Ovaries not definitely visualized. There is a well-defined round 3.6 cm cystic structure in the right pelvis. This could represent an abscess versus  peritoneal inclusion cyst or ovarian remnant cyst. Other: None. Musculoskeletal: No acute findings. IMPRESSION: 1. Evidence of distal small bowel obstruction with several areas of small-bowel wall thickening and small bowel narrowing over the lower abdomen/pelvis likely the etiology of the obstruction and due to adhesions. Mild-to-moderate associated free pelvic fluid. 2. 3.6 cm round cystic structure over the right pelvis which may be due to postsurgical abscess, peritoneal inclusion cyst or ovarian cyst/ovarian remnant. Post hysterectomy. 3.  Cholelithiasis. Electronically Signed   By: Marin Olp M.D.   On: 04/09/2019 13:14   DG Abd Acute W/Chest  Result Date: 04/09/2019 CLINICAL DATA:  Abdominal pain.  Hysterectomy 03/28/2019 EXAM: DG ABDOMEN ACUTE W/ 1V CHEST COMPARISON:  None. FINDINGS: Normal heart size and mediastinal contours. No acute infiltrate or edema. No effusion or pneumothorax. No acute osseous findings. Dilated small bowel out of proportion to colon, with multiple fluid levels. No evidence of pneumoperitoneum. IMPRESSION: Diffuse small bowel dilatation with fluid levels. Although postoperative history suggests ileus there is no colonic distension and the pattern is more suggestive of small bowel obstruction. Suggest abdominal CT. Electronically Signed   By: Monte Fantasia M.D.   On: 04/09/2019 09:44    ROS - all of the below systems have been reviewed with the patient and positives are indicated with bold text General: chills, fever or night sweats Eyes: blurry vision or double vision ENT: epistaxis or sore throat Allergy/Immunology: itchy/watery eyes or nasal congestion Hematologic/Lymphatic: bleeding problems, blood clots or swollen lymph nodes Endocrine: temperature intolerance or unexpected weight changes Breast: new or changing breast lumps or nipple discharge Resp: cough, shortness of breath, or wheezing CV: chest pain or dyspnea on exertion GI: as per HPI GU: dysuria,  trouble voiding, or hematuria MSK: joint pain or joint stiffness Neuro: TIA or stroke symptoms Derm: pruritus and skin lesion changes Psych: anxiety and depression  PE Blood pressure (!) 124/92, pulse 83, temperature  98.4 F (36.9 C), resp. rate 20, last menstrual period 03/20/2019, SpO2 98 %. Constitutional: NAD; conversant; no deformities Eyes: Moist conjunctiva; no lid lag; anicteric; PERRL Neck: Trachea midline; no thyromegaly Lungs: Normal respiratory effort; no tactile fremitus CV: RRR; no palpable thrills; no pitting edema GI: Abd mostly soft, distended, incisions clean dry and intact, abdominal tenderness throughout but more so in the upper abdomen but no peritonitis or rebound; no palpable hepatosplenomegaly MSK: Normal gait; no clubbing/cyanosis Psychiatric: Appropriate affect; alert and oriented x3 Lymphatic: No palpable cervical or axillary lymphadenopathy  Results for orders placed or performed during the hospital encounter of 04/09/19 (from the past 48 hour(s))  Lipase, blood     Status: None   Collection Time: 04/09/19 11:01 AM  Result Value Ref Range   Lipase 32 11 - 51 U/L    Comment: Performed at [facility], [place] [address] [place], [place] [address]  Comprehensive metabolic panel     Status: Abnormal   Collection Time: 04/09/19 11:01 AM  Result Value Ref Range   Sodium 139 135 - 145 mmol/L   Potassium 3.8 3.5 - 5.1 mmol/L   Chloride 101 98 - 111 mmol/L   CO2 25 22 - 32 mmol/L   Glucose, Bld 103 (H) 70 - 99 mg/dL   BUN 8 6 - 20 mg/dL   Creatinine, Ser 0.85 0.44 - 1.00 mg/dL   Calcium 10.0 8.9 - 10.3 mg/dL   Total Protein 7.4 6.5 - 8.1 g/dL   Albumin 3.8 3.5 - 5.0 g/dL   AST 34 15 - 41 U/L   ALT 41 0 - 44 U/L   Alkaline Phosphatase 52 38 - 126 U/L   Total Bilirubin 0.7 0.3 - 1.2 mg/dL   GFR calc non Af Amer >60 >60 mL/min   GFR calc Af Amer >60 >60 mL/min   Anion gap 13 5 - 15    Comment: Performed at [facility], [place] [address],  [place], [place] [address]  CBC     Status: Abnormal   Collection Time: 04/09/19 11:01 AM  Result Value Ref Range   WBC 6.5 4.0 - 10.5 K/uL   RBC 4.67 3.87 - 5.11 MIL/uL   Hemoglobin 12.3 12.0 - 15.0 g/dL   HCT 37.9 36.0 - 46.0 %   MCV 81.2 80.0 - 100.0 fL   MCH 26.3 26.0 - 34.0 pg   MCHC 32.5 30.0 - 36.0 g/dL   RDW 11.5 11.5 - 15.5 %   Platelets 429 (H) 150 - 400 K/uL   nRBC 0.0 0.0 - 0.2 %    Comment: Performed at [facility], [place] [address], [place],  [address]  Urinalysis, Routine w reflex microscopic     Status: Abnormal   Collection Time: 04/09/19 11:15 AM  Result Value Ref Range   Color, Urine YELLOW YELLOW   APPearance HAZY (A) CLEAR   Specific Gravity, Urine 1.021 1.005 - 1.030   pH 5.0 5.0 - 8.0   Glucose, UA NEGATIVE NEGATIVE mg/dL   Hgb urine dipstick MODERATE (A) NEGATIVE   Bilirubin Urine NEGATIVE NEGATIVE   Ketones, ur NEGATIVE NEGATIVE mg/dL   Protein, ur 30 (A) NEGATIVE mg/dL   Nitrite NEGATIVE NEGATIVE   Leukocytes,Ua SMALL (A) NEGATIVE   RBC / HPF 0-5 0 - 5 RBC/hpf   WBC, UA 11-20 0 - 5 WBC/hpf   Bacteria, UA NONE SEEN NONE SEEN   Squamous Epithelial / LPF 11-20 0 - 5   Mucus  PRESENT    Hyaline Casts, UA PRESENT     Comment: Performed at [facility], [place] [address] [place], [place] [id number]  Salicylate level     Status: Abnormal   Collection Time: 04/09/19 11:43 AM  Result Value Ref Range   Salicylate Lvl Q000111Q (L) 7.0 - 30.0 mg/dL    Comment: Performed at [place] [address] [place], [place] [address]  Acetaminophen level     Status: Abnormal   Collection Time: 04/09/19 11:43 AM  Result Value Ref Range   Acetaminophen (Tylenol), Serum <10 (L) 10 - 30 ug/mL    Comment: (NOTE) Therapeutic concentrations vary significantly. A range of 10-30 ug/mL  may be an effective concentration for many patients. However, some  are best treated at concentrations outside of this range. Acetaminophen concentrations >150 ug/mL at 4  hours after ingestion  and >50 ug/mL at 12 hours after ingestion are often associated with  toxic reactions. Performed at [facility], [place] [address], [place], [place] [address]     CT ABDOMEN PELVIS W CONTRAST  Result Date: 04/09/2019 CLINICAL DATA:  Hysterectomy 03/28/2019 recent onset nausea, vomiting and diarrhea past 3 days now with abdominal pain and bloating. EXAM: CT ABDOMEN AND PELVIS WITH CONTRAST TECHNIQUE: Multidetector CT imaging of the abdomen and pelvis was performed using the standard protocol following bolus administration of intravenous contrast. CONTRAST:  163mL OMNIPAQUE IOHEXOL 300 MG/ML  SOLN COMPARISON:  None. FINDINGS: Lower chest: Lung bases are clear. Hepatobiliary: Evidence of moderate cholelithiasis with multiple low-density stones present. Liver and biliary tree are normal. Pancreas: Normal. Spleen: Normal. Adrenals/Urinary Tract: Adrenal glands are normal. Kidneys are normal in size without hydronephrosis or nephrolithiasis. Ureters and bladder are normal. Stomach/Bowel: Stomach is normal. Appendix is normal. Colon is decompressed. There multiple air and fluid-filled dilated small bowel loops measuring up to 3.4 cm in diameter. There are several areas of narrowing of small bowel and small bowel wall thickening over the lower abdomen/pelvis likely the cause of this small bowel obstruction and likely due to adhesions. There is no free peritoneal air. No evidence of pneumatosis. Mild-to-moderate associated free fluid in the pelvis. Vascular/Lymphatic: Normal. Reproductive: Evidence of patient's recent hysterectomy. Ovaries not definitely visualized. There is a well-defined round 3.6 cm cystic structure in the right pelvis. This could represent an abscess versus peritoneal inclusion cyst or ovarian remnant cyst. Other: None. Musculoskeletal: No acute findings. IMPRESSION: 1. Evidence of distal small bowel obstruction with several areas of small-bowel wall thickening and  small bowel narrowing over the lower abdomen/pelvis likely the etiology of the obstruction and due to adhesions. Mild-to-moderate associated free pelvic fluid. 2. 3.6 cm round cystic structure over the right pelvis which may be due to postsurgical abscess, peritoneal inclusion cyst or ovarian cyst/ovarian remnant. Post hysterectomy. 3.  Cholelithiasis. Electronically Signed   By: Marin Olp M.D.   On: 04/09/2019 13:14   DG Abd Acute W/Chest  Result Date: 04/09/2019 CLINICAL DATA:  Abdominal pain.  Hysterectomy 03/28/2019 EXAM: DG ABDOMEN ACUTE W/ 1V CHEST COMPARISON:  None. FINDINGS: Normal heart size and mediastinal contours. No acute infiltrate or edema. No effusion or pneumothorax. No acute osseous findings. Dilated small bowel out of proportion to colon, with multiple fluid levels. No evidence of pneumoperitoneum. IMPRESSION: Diffuse small bowel dilatation with fluid levels. Although postoperative history suggests ileus there is no colonic distension and the pattern is more suggestive of small bowel obstruction. Suggest abdominal CT. Electronically Signed   By:  Monte Fantasia M.D.   On: 04/09/2019 09:44    Imaging: I personally reviewed the imaging and labs  A/P: Lallie Sherilynn Frerking is an 49 y.o. female with a postoperative small bowel obstruction after undergoing total laparoscopic hysterectomy with right salpingectomy, left oophorectomy and extensive adhesiolysis  There is no sign of free air.  The fluid collection in the pelvis could be a sterile fluid collection or early abscess but she has no fever, or leukocytosis therefore I think watchful observation of the fluid collection in the pelvis can be done.  Recommend bowel rest, NG tube decompression, small bowel obstruction protocol  If does not open up in the next few days would consider asking IR to evaluate fluid collection in pelvis as it may be a source/nidus of her small bowel obstruction  I discussed bowel obstructions with  the patient along with management and the purpose of the small bowel obstruction protocol along with placement of NG tube decompression  We will follow along in consultation  Leighton Ruff. Redmond Pulling, MD, FACS General, Bariatric, & Minimally Invasive Surgery [facility], [place]

## 2019-04-09 NOTE — ED Notes (Signed)
Attempted IV x2, blood return noted at L wrist, not patent and swelling with flush, unable to obtain. 2nd RN to attempt

## 2019-04-09 NOTE — ED Triage Notes (Signed)
Pt states last bowel movement was Thursday. Pt was able to eat chicken noodle soup last night, has not eaten since. Last vomited Thursday but has continued to have abd pain that ranges from 3 to 8/10 and is consistently above "right above the navel"

## 2019-04-09 NOTE — Discharge Instructions (Addendum)
49 year old female s/p laparoscopic hysterectomy with right salpingectomy/left ovarian cystectomy with left oophorectomy on 03/28/2019 comes in for 4-day history of abdominal pain.  She had first started with nausea, vomiting, diarrhea, for which vomiting and diarrhea has resolved for the past 2 days.  She still having intermittent nausea.  Has been able to tolerate fluid intake and mild solid food intake.  She has continued with generalized bloating, epigastric pain.  Has not had bowel movement, and has not passed gas for the past 2 days.  Denies fever, chills, body aches.  Denies urinary symptoms, vaginal symptoms.  Denies erythema, warmth, drainage to incision sites.   KUB: Diffuse small bowel dilatation with fluid levels. Although postoperative history suggests ileus there is no colonic distension and the pattern is more suggestive of small bowel obstruction. Suggest abdominal CT.  Given KUB results, patient discharged in stable condition to ED for further evaluation and management needed.

## 2019-04-09 NOTE — ED Provider Notes (Signed)
[facility]    CSN: [id number] Arrival date & time: 04/09/19  0803      History   Chief Complaint Chief Complaint  Patient presents with  . Nausea  . Abdominal Pain    HPI Angela Glover is a 49 y.o. female.   49 year old female status post laparoscopic hysterectomy with right salpingectomy/left ovarian cystectomy with left oophorectomy on 03/28/2019 comes in for 4-day history of abdominal pain.  Prior to current symptom onset, patient states had no significant pain or bloating since surgery.  She at first started with nausea, vomiting, diarrhea.  States vomiting and diarrhea has resolved in the past 2 days.  Still having intermittent nausea.  Though able to now tolerate fluid intake and mild food intake.  She has been having abdominal pain, mostly epigastric, though can be generalized.  She describes this as bloating sensation, worse with oral intake.  Since diarrhea resolved 2 days ago, has not had another bowel movement, and denies passing gas.  Denies fever, chills, body aches.  Denies erythema, warmth, drainage to incision site.  Denies urinary symptoms such as frequency, dysuria, hematuria.  Denies vaginal symptoms such as discharge, spotting, itching.  She had been taking ibuprofen 800 mg every 8 hours, ibuprofen 1000 mg every 4 hours daily since surgery, and discontinued 4 days ago when symptom onset.     Past Medical History:  Diagnosis Date  . Abnormal Pap smear of cervix   . Anemia   . Anxiety   . Asthma    exercise induced  . Endometriosis   . Headache    migraines-occassionally when does not have glasses on  . Infertility, female     Patient Active Problem List   Diagnosis Date Noted  . Status post laparoscopic hysterectomy 03/28/2019  . Asthma 03/25/2017  . Knee pain 10/16/2016  . Low back pain 10/16/2016    Past Surgical History:  Procedure Laterality Date  . CYSTOSCOPY N/A 03/28/2019   Procedure: CYSTOSCOPY;  Surgeon: Salvadore Dom, MD;  Location: [facility];  Service: Gynecology;  Laterality: N/A;  . SALPINGECTOMY     left due to ectopic, laparotomy. Endometriosis noted.   Marland Kitchen TOTAL LAPAROSCOPIC HYSTERECTOMY WITH SALPINGECTOMY Bilateral 03/28/2019   Procedure: TOTAL LAPAROSCOPIC HYSTERECTOMY WITH RIGHT SALPINGECTOMY/LEFT OVARIAN CYSTECTOMY WITH LEFT OOPHERECTOMY;  Surgeon: Salvadore Dom, MD;  Location: [place];  Service: Gynecology;  Laterality: Bilateral;  LEFT OVARIAN CYSTECTOMY WITH POSSIBLE LEFT OOPHERECTOMY/EXTENDED RECOVERY BED/3 hour surgery time/ks    OB History    Gravida  2   Para  0   Term  0   Preterm  0   AB  2   Living  0     SAB  1   TAB      Ectopic  1   Multiple      Live Births               Home Medications    Prior to Admission medications   Medication Sig Start Date End Date Taking? Authorizing Provider  acetaminophen (TYLENOL) 500 MG tablet Take 2 tablets (1,000 mg total) by mouth every 6 (six) hours. 03/29/19   Salvadore Dom, MD  albuterol (VENTOLIN HFA) 108 (90 Base) MCG/ACT inhaler Inhale 2 puffs into the lungs every 6 (six) hours as needed for wheezing or shortness of breath.    [provider]  cholecalciferol (VITAMIN D3) 25 MCG (1000 UNIT) tablet Take 1,000 Units by mouth daily.  [provider]  docusate sodium (COLACE) 100 MG capsule Take 1 capsule (100 mg total) by mouth 2 (two) times daily. 03/29/19   Salvadore Dom, MD  ibuprofen (ADVIL) 800 MG tablet Take 1 tablet (800 mg total) by mouth every 8 (eight) hours as needed. 03/29/19   Salvadore Dom, MD  magnesium oxide (MAG-OX) 400 MG tablet Take 400 mg by mouth daily.    [provider]  Multiple Vitamin (MULTIVITAMIN WITH MINERALS) TABS tablet Take 1 tablet by mouth daily.    [provider]  triamcinolone cream (KENALOG) 0.1 % Apply 1 application topically daily as needed (itching).    [provider]     Family History Family History  Problem Relation Age of Onset  . Cancer Maternal Aunt        colon  . Breast cancer Maternal Aunt        does not know age  . Cancer Maternal Grandmother        stomach  . Breast cancer Paternal Aunt     Social History Social History   Tobacco Use  . Smoking status: Never Smoker  . Smokeless tobacco: Never Used  Substance Use Topics  . Alcohol use: No  . Drug use: No     Allergies   Latex   Review of Systems Review of Systems  Reason unable to perform ROS: See HPI as above.     Physical Exam Triage Vital Signs ED Triage Vitals  Enc Vitals Group     BP      Pulse      Resp      Temp      Temp src      SpO2      Weight      Height      Head Circumference      Peak Flow      Pain Score      Pain Loc      Pain Edu?      Excl. in [place]?    No data found.  Updated Vital Signs BP 126/81 (BP Location: Right Arm)   Pulse 92   Temp 98.5 F (36.9 C) (Oral)   Resp 18   LMP 03/20/2019 (Approximate)   SpO2 99%   Visual Acuity Right Eye Distance:   Left Eye Distance:   Bilateral Distance:    Right Eye Near:   Left Eye Near:    Bilateral Near:     Physical Exam Constitutional:      General: She is not in acute distress.    Appearance: She is well-developed. She is not ill-appearing, toxic-appearing or diaphoretic.  HENT:     Head: Normocephalic and atraumatic.  Eyes:     Conjunctiva/sclera: Conjunctivae normal.     Pupils: Pupils are equal, round, and reactive to light.  Cardiovascular:     Rate and Rhythm: Normal rate and regular rhythm.  Pulmonary:     Effort: Pulmonary effort is normal. No respiratory distress.     Comments: LCTAB Abdominal:     Comments: Incision site clean and dry.  No drainage, erythema, warmth.   Abdomen slightly distended, though still soft. +BS in all 4 quadrants without obvious hyperactive sounds.  Mild tenderness diffusely to palpation.  No obvious guarding or rebound.  Musculoskeletal:      Cervical back: Normal range of motion and neck supple.  Skin:    General: Skin is warm and dry.  Neurological:     Mental  Status: She is alert and oriented to person, place, and time.  Psychiatric:        Behavior: Behavior normal.        Judgment: Judgment normal.      UC Treatments / Results  Labs (all labs ordered are listed, but only abnormal results are displayed) Labs Reviewed  CBC WITH DIFFERENTIAL/PLATELET  COMPREHENSIVE METABOLIC PANEL    EKG   Radiology DG Abd Acute W/Chest  Result Date: 04/09/2019 CLINICAL DATA:  Abdominal pain.  Hysterectomy 03/28/2019 EXAM: DG ABDOMEN ACUTE W/ 1V CHEST COMPARISON:  None. FINDINGS: Normal heart size and mediastinal contours. No acute infiltrate or edema. No effusion or pneumothorax. No acute osseous findings. Dilated small bowel out of proportion to colon, with multiple fluid levels. No evidence of pneumoperitoneum. IMPRESSION: Diffuse small bowel dilatation with fluid levels. Although postoperative history suggests ileus there is no colonic distension and the pattern is more suggestive of small bowel obstruction. Suggest abdominal CT. Electronically Signed   By: Monte Fantasia M.D.   On: 04/09/2019 09:44    Procedures Procedures (including critical care time)  Medications Ordered in UC Medications  alum & mag hydroxide-simeth (MAALOX/MYLANTA) 200-200-20 MG/5ML suspension 30 mL (30 mLs Oral Given 04/09/19 0921)    And  lidocaine (XYLOCAINE) 2 % viscous mouth solution 15 mL (15 mLs Oral Given 04/09/19 0921)    Initial Impression / Assessment and Plan / UC Course  I have reviewed the triage vital signs and the nursing notes.  Pertinent labs & imaging results that were available during my care of the patient were reviewed by me and considered in my medical decision making (see chart for details).    49 year old female s/p laparoscopic hysterectomy with right salpingectomy/left ovarian cystectomy with left oophorectomy on  03/28/2019 comes in for 4-day history of abdominal pain.  She had first started with nausea, vomiting, diarrhea, for which vomiting and diarrhea has resolved for the past 2 days.  She still having intermittent nausea.  Has been able to tolerate fluid intake and mild solid food intake.  She has continued with generalized bloating, epigastric pain.  Has not had bowel movement, and has not passed gas for the past 2 days.  Denies fever, chills, body aches.  Denies urinary symptoms, vaginal symptoms.  Denies erythema, warmth, drainage to incision sites.   Case discussed with Dr. Chauncy Passy day, who suggested GI cocktail for symptomatic management, KUB, CBC, CMP.  KUB: Diffuse small bowel dilatation with fluid levels. Although postoperative history suggests ileus there is no colonic distension and the pattern is more suggestive of small bowel obstruction. Suggest abdominal CT.  Given KUB results, patient discharged in stable condition to ED for CT abdomen and  further evaluation/management needed.  Patient expresses understanding and agrees to plan.  Given patient to ED evaluation, will discontinue sending CBC/CMP.  Final Clinical Impressions(s) / UC Diagnoses   Final diagnoses:  Abdominal pain, epigastric  Small bowel obstruction [facility])    ED Prescriptions    None     I have reviewed the PDMP during this encounter.   Ok Edwards, PA-C 04/09/19 1009

## 2019-04-09 NOTE — ED Triage Notes (Signed)
Pt sent from [facility] for abd CT and possible small bowel obstruction.  Pt had hysterectomy on 2/8.  Reports upper abd pain and nausea x 4 days that started after eating sloppy [employer].  Reports vomiting and diarrhea on Wednesday and Thursday that resolved.

## 2019-04-09 NOTE — H&P (Signed)
Gynecology Admission H&P  HPI: Angela Glover is a 49 y.o. female POD#12 from a total laparoscopic hysterectomy with right salpingectomy, left oophorectomy, extensive adhesiolysis and cystoscopy on 03/28/2019 with Dr. Talbert Nan with indication of a fibroid uterus, endometriosis, severe dysmenorrhea, with intraoperative findings of severe abdominal and pelvic adhesions.  She presents to the ED with a 3-4 day history of upper abdominal pain and bloating.  She woke up early on Wednesday AM with rather sudden onset of the abdominal pain, nausea, vomiting, diarrhea.  She states her last bowel movement was 2 days ago on Thursday and leading up to that she was having loose stools.  Her symptoms somewhat got better by yesterday but she continues to feel nausea and abdominal pain.  Up until then since the surgery she had been doing quite well managing pain with ibuprofen and Tylenol.  She had been able to tolerate small amounts of fluids and food.  He says she has passed a small amount of gas this morning on 2 occasions when up and moving about but otherwise has been belching frequently.  Not having any excessive vaginal bleeding or abnormal discharge.  Denies fever chills or sweating.  Past Medical History:  Diagnosis Date  . Abnormal Pap smear of cervix   . Anemia   . Anxiety   . Asthma    exercise induced  . Endometriosis   . Headache    migraines-occassionally when does not have glasses on  . Infertility, female    Past Surgical History:  Procedure Laterality Date  . CYSTOSCOPY N/A 03/28/2019   Procedure: CYSTOSCOPY;  Surgeon: Salvadore Dom, MD;  Location: [facility];  Service: Gynecology;  Laterality: N/A;  . SALPINGECTOMY     left due to ectopic, laparotomy. Endometriosis noted.   Marland Kitchen TOTAL LAPAROSCOPIC HYSTERECTOMY WITH SALPINGECTOMY Bilateral 03/28/2019   Procedure: TOTAL LAPAROSCOPIC HYSTERECTOMY WITH RIGHT SALPINGECTOMY/LEFT OVARIAN CYSTECTOMY WITH LEFT OOPHERECTOMY;   Surgeon: Salvadore Dom, MD;  Location: [place];  Service: Gynecology;  Laterality: Bilateral;  LEFT OVARIAN CYSTECTOMY WITH POSSIBLE LEFT OOPHERECTOMY/EXTENDED RECOVERY BED/3 hour surgery time/ks   Family History  Problem Relation Age of Onset  . Cancer Maternal Aunt        colon  . Breast cancer Maternal Aunt        does not know age  . Cancer Maternal Grandmother        stomach  . Breast cancer Paternal Aunt    Social History   Tobacco Use  . Smoking status: Never Smoker  . Smokeless tobacco: Never Used  Substance Use Topics  . Alcohol use: No  . Drug use: No   Allergies  Allergen Reactions  . Oxycodone Itching  . Latex Rash    Burning   No current facility-administered medications on file prior to encounter.   Current Outpatient Medications on File Prior to Encounter  Medication Sig Dispense Refill  . acetaminophen (TYLENOL) 500 MG tablet Take 2 tablets (1,000 mg total) by mouth every 6 (six) hours. 30 tablet 0  . albuterol (VENTOLIN HFA) 108 (90 Base) MCG/ACT inhaler Inhale 2 puffs into the lungs every 6 (six) hours as needed for wheezing or shortness of breath.    . cholecalciferol (VITAMIN D3) 25 MCG (1000 UNIT) tablet Take 1,000 Units by mouth daily.    Marland Kitchen docusate sodium (COLACE) 100 MG capsule Take 1 capsule (100 mg total) by mouth 2 (two) times daily. 30 capsule 0  . ibuprofen (ADVIL) 800 MG tablet Take  1 tablet (800 mg total) by mouth every 8 (eight) hours as needed. 30 tablet 1  . magnesium oxide (MAG-OX) 400 MG tablet Take 400 mg by mouth daily.    . Multiple Vitamin (MULTIVITAMIN WITH MINERALS) TABS tablet Take 1 tablet by mouth daily.    Marland Kitchen triamcinolone cream (KENALOG) 0.1 % Apply 1 application topically daily as needed (itching).     Review of Systems - General ROS: negative ENT ROS: negative for - epistaxis, headaches or sore throat Endocrine ROS: negative for - palpitations Respiratory ROS: no cough, shortness of breath, or  wheezing Cardiovascular ROS: no chest pain or dyspnea on exertion Gastrointestinal ROS: positive for - abdominal pain, appetite loss, change in bowel habits, change in stools, gas/bloating and nausea/vomiting Genito-Urinary ROS: negative for - dysuria Neurological ROS: no TIA or stroke symptoms Dermatological ROS: negative for pruritus, rash and skin lesion changes  Physical exam BP 117/80   Pulse 67   Temp 98.4 F (36.9 C)   Resp 20   LMP 03/20/2019 (Approximate)   SpO2 100%   General: No acute distress, appears well overall Eyes: Anicteric sclera, no conjunctivitis Neck: Soft supple, no masses Lungs: Normal respiratory effort without notable retraction CV: Regular rate and rhythm, distal pulses intact, no pitting edema GI: Abdomen soft, moderately distended, moderate tenderness in upper abdomen, minimal tenderness in lower abdomen.  4 incisions are clean dry and intact at the umbilicus, midline suprapubic, and bilaterally lateral abdomen. GU: Pelvic exam deferred MSK: No clubbingor cyanosis, no lower extremity edema Psychiatric: Pleasant affect, normally conversant with normal thought patterns Neurologic: No gross abnormalities noted  Imaging: CT abdomen and pelvis with IV contrast IMPRESSION: 1. Evidence of distal small bowel obstruction with several areas of small-bowel wall thickening and small bowel narrowing over the lower abdomen/pelvis likely the etiology of the obstruction and due to adhesions. Mild-to-moderate associated free pelvic fluid. 2. 3.6 cm round cystic structure over the right pelvis which may be due to postsurgical abscess, peritoneal inclusion cyst or ovarian cyst/ovarian remnant. Post hysterectomy.  Imaging is personally reviewed  Assessment and Plan 48 y.o. female presenting with post operative small bowel obstruction POD#12 s/p TLH, right salpingectomy, left oophorectomy and extensive adhesiolysis, finding of right pelvic fluid collection on CT  imaging  Admit patient for inpatient treatment of SBO.  Consultation to general surgery, appreciate Dr. Dois Davenport input and assistance with management.  Plan is for NG tube decompression while NPO.  Will recheck electrolytes and CBC in the morning.  Continue IV LR fluids.    Pain control with IV Toradol and IV Dilaudid prn.  Postoperative fluid collection differential diagnosis includes ovarian cyst, peritoneal inclusion cyst, postoperative abscess.  As she has no leukocytosis, afebrile, and no evidence of infection at this time, I would lean towards being something other than abscess.  Will monitor for signs of infection.    Joseph Pierini, MD Gynecology [facility]

## 2019-04-09 NOTE — ED Triage Notes (Signed)
Pt here after having hysterectomy on 2/8; pt  sts having N/V/D starting Wednesday and now having abd pain and bloating

## 2019-04-10 ENCOUNTER — Inpatient Hospital Stay (HOSPITAL_COMMUNITY): Payer: BC Managed Care – PPO

## 2019-04-10 DIAGNOSIS — K56609 Unspecified intestinal obstruction, unspecified as to partial versus complete obstruction: Secondary | ICD-10-CM

## 2019-04-10 LAB — CBC WITH DIFFERENTIAL/PLATELET
Abs Immature Granulocytes: 0.01 10*3/uL (ref 0.00–0.07)
Basophils Absolute: 0 10*3/uL (ref 0.0–0.1)
Basophils Relative: 0 %
Eosinophils Absolute: 0.2 10*3/uL (ref 0.0–0.5)
Eosinophils Relative: 2 %
HCT: 32.7 % — ABNORMAL LOW (ref 36.0–46.0)
Hemoglobin: 10.6 g/dL — ABNORMAL LOW (ref 12.0–15.0)
Immature Granulocytes: 0 %
Lymphocytes Relative: 24 %
Lymphs Abs: 1.6 10*3/uL (ref 0.7–4.0)
MCH: 26.5 pg (ref 26.0–34.0)
MCHC: 32.4 g/dL (ref 30.0–36.0)
MCV: 81.8 fL (ref 80.0–100.0)
Monocytes Absolute: 0.4 10*3/uL (ref 0.1–1.0)
Monocytes Relative: 6 %
Neutro Abs: 4.5 10*3/uL (ref 1.7–7.7)
Neutrophils Relative %: 68 %
Platelets: 395 10*3/uL (ref 150–400)
RBC: 4 MIL/uL (ref 3.87–5.11)
RDW: 11.6 % (ref 11.5–15.5)
WBC: 6.7 10*3/uL (ref 4.0–10.5)
nRBC: 0 % (ref 0.0–0.2)

## 2019-04-10 LAB — BASIC METABOLIC PANEL
Anion gap: 11 (ref 5–15)
BUN: 9 mg/dL (ref 6–20)
CO2: 28 mmol/L (ref 22–32)
Calcium: 9.3 mg/dL (ref 8.9–10.3)
Chloride: 103 mmol/L (ref 98–111)
Creatinine, Ser: 0.89 mg/dL (ref 0.44–1.00)
GFR calc Af Amer: >60 mL/min (ref >60)
GFR calc non Af Amer: >60 mL/min (ref >60)
Glucose, Bld: 85 mg/dL (ref 70–99)
Potassium: 3.6 mmol/L (ref 3.5–5.1)
Sodium: 142 mmol/L (ref 135–145)

## 2019-04-10 MED ORDER — ORAL CARE MOUTH RINSE
15.0000 mL | Freq: Two times a day (BID) | OROMUCOSAL | Status: DC
  Administered 2019-04-10 – 2019-04-13 (×4): 15 mL via OROMUCOSAL

## 2019-04-10 NOTE — Progress Notes (Signed)
Patient ID: Angela Glover, female   DOB: Dec 24, 1970, 50 y.o.   MRN: [id number] [facility] Surgery Progress Note:   * No surgery found *  Subjective: Mental status is clear Objective: Vital signs in last 24 hours: Temp:  [98.4 F (36.9 C)-98.7 F (37.1 C)] 98.4 F (36.9 C) (02/21 0409) Pulse Rate:  [66-91] 84 (02/21 0409) Resp:  [17-20] 17 (02/21 0409) BP: (115-136)/(77-93) 123/80 (02/21 0409) SpO2:  [98 %-100 %] 98 % (02/21 0409)  Intake/Output from previous day: 02/20 0701 - 02/21 0700 In: 1463.4 [P.O.:50; I.V.:1323.4; NG/GT:90] Out: 1150 [Urine:250; Emesis/NG output:900] Intake/Output this shift: No intake/output data recorded.  Physical Exam: Work of breathing is normal.  NG irritating and NG output is increased by ice chips.  Abdomen is mildly distended.   Lab Results:  Results for orders placed or performed during the hospital encounter of 04/09/19 (from the past 48 hour(s))  Lipase, blood     Status: None   Collection Time: 04/09/19 11:01 AM  Result Value Ref Range   Lipase 32 11 - 51 U/L    Comment: Performed at [facility], [place] [address] [place], [place] [address]  Comprehensive metabolic panel     Status: Abnormal   Collection Time: 04/09/19 11:01 AM  Result Value Ref Range   Sodium 139 135 - 145 mmol/L   Potassium 3.8 3.5 - 5.1 mmol/L   Chloride 101 98 - 111 mmol/L   CO2 25 22 - 32 mmol/L   Glucose, Bld 103 (H) 70 - 99 mg/dL   BUN 8 6 - 20 mg/dL   Creatinine, Ser 0.85 0.44 - 1.00 mg/dL   Calcium 10.0 8.9 - 10.3 mg/dL   Total Protein 7.4 6.5 - 8.1 g/dL   Albumin 3.8 3.5 - 5.0 g/dL   AST 34 15 - 41 U/L   ALT 41 0 - 44 U/L   Alkaline Phosphatase 52 38 - 126 U/L   Total Bilirubin 0.7 0.3 - 1.2 mg/dL   GFR calc non Af Amer >60 >60 mL/min   GFR calc Af Amer >60 >60 mL/min   Anion gap 13 5 - 15    Comment: Performed at [facility], [place] [address], [place], [place] [address]  CBC     Status: Abnormal   Collection Time: 04/09/19  11:01 AM  Result Value Ref Range   WBC 6.5 4.0 - 10.5 K/uL   RBC 4.67 3.87 - 5.11 MIL/uL   Hemoglobin 12.3 12.0 - 15.0 g/dL   HCT 37.9 36.0 - 46.0 %   MCV 81.2 80.0 - 100.0 fL   MCH 26.3 26.0 - 34.0 pg   MCHC 32.5 30.0 - 36.0 g/dL   RDW 11.5 11.5 - 15.5 %   Platelets 429 (H) 150 - 400 K/uL   nRBC 0.0 0.0 - 0.2 %    Comment: Performed at [facility], [place] [address], [place], [place] [address]  Urinalysis, Routine w reflex microscopic     Status: Abnormal   Collection Time: 04/09/19 11:15 AM  Result Value Ref Range   Color, Urine YELLOW YELLOW   APPearance HAZY (A) CLEAR   Specific Gravity, Urine 1.021 1.005 - 1.030   pH 5.0 5.0 - 8.0   Glucose, UA NEGATIVE NEGATIVE mg/dL   Hgb urine dipstick MODERATE (A) NEGATIVE   Bilirubin Urine NEGATIVE NEGATIVE   Ketones, ur NEGATIVE NEGATIVE mg/dL   Protein, ur 30 (A) NEGATIVE mg/dL   Nitrite NEGATIVE NEGATIVE  Leukocytes,Ua SMALL (A) NEGATIVE   RBC / HPF 0-5 0 - 5 RBC/hpf   WBC, UA 11-20 0 - 5 WBC/hpf   Bacteria, UA NONE SEEN NONE SEEN   Squamous Epithelial / LPF 11-20 0 - 5   Mucus PRESENT    Hyaline Casts, UA PRESENT     Comment: Performed at [facility], [place] [address], [place], [place] [id number]  Salicylate level     Status: Abnormal   Collection Time: 04/09/19 11:43 AM  Result Value Ref Range   Salicylate Lvl [id number] (L) 7.0 - 30.0 mg/dL    Comment: Performed at [place] [address], [place], [place] [address]  Acetaminophen level     Status: Abnormal   Collection Time: 04/09/19 11:43 AM  Result Value Ref Range   Acetaminophen (Tylenol), Serum <10 (L) 10 - 30 ug/mL    Comment: (NOTE) Therapeutic concentrations vary significantly. A range of 10-30 ug/mL  may be an effective concentration for many patients. However, some  are best treated at concentrations outside of this range. Acetaminophen concentrations >150 ug/mL at 4 hours after ingestion  and >50 ug/mL at 12 hours after ingestion are often  associated with  toxic reactions. Performed at [facility], [place] [address] [place], [place] [address]   Respiratory Panel by RT PCR (Flu A&B, Covid) - Nasopharyngeal Swab     Status: None   Collection Time: 04/09/19  2:01 PM   Specimen: Nasopharyngeal Swab  Result Value Ref Range   SARS Coronavirus 2 by RT PCR NEGATIVE NEGATIVE    Comment: (NOTE) SARS-CoV-2 target nucleic acids are NOT DETECTED. The SARS-CoV-2 RNA is generally detectable in upper respiratoy specimens during the acute phase of infection. The lowest concentration of SARS-CoV-2 viral copies this assay can detect is 131 copies/mL. A negative result does not preclude SARS-Cov-2 infection and should not be used as the sole basis for treatment or other patient management decisions. A negative result may occur with  improper specimen collection/handling, submission of specimen other than nasopharyngeal swab, presence of viral mutation(s) within the areas targeted by this assay, and inadequate number of viral copies (<131 copies/mL). A negative result must be combined with clinical observations, patient history, and epidemiological information. The expected result is Negative. Fact Sheet for Patients:  PinkCheek.be Fact Sheet for Healthcare Providers:  GravelBags.it This test is not yet ap proved or cleared by the [place] FDA and  has been authorized for detection and/or diagnosis of SARS-CoV-2 by FDA under an Emergency Use Authorization (EUA). This EUA will remain  in effect (meaning this test can be used) for the duration of the COVID-19 declaration under Section 564(b)(1) of the Act, 21 U.S.C. section 360bbb-3(b)(1), unless the authorization is terminated or revoked sooner.    Influenza A by PCR NEGATIVE NEGATIVE   Influenza B by PCR NEGATIVE NEGATIVE    Comment: (NOTE) The Xpert Xpress SARS-CoV-2/FLU/RSV assay is intended as an aid in  the  diagnosis of influenza from Nasopharyngeal swab specimens and  should not be used as a sole basis for treatment. Nasal washings and  aspirates are unacceptable for Xpert Xpress SARS-CoV-2/FLU/RSV  testing. Fact Sheet for Patients: PinkCheek.be Fact Sheet for Healthcare Providers: GravelBags.it This test is not yet approved or cleared by the [place] FDA and  has been authorized for detection and/or diagnosis of SARS-CoV-2 by  FDA under an Emergency Use Authorization (EUA). This EUA will remain  in effect (meaning this test can be  used) for the duration of the  Covid-19 declaration under Section 564(b)(1) of the Act, 21  U.S.C. section 360bbb-3(b)(1), unless the authorization is  terminated or revoked. Performed at [facility], [place] [address] [place], [place] [id number]   Basic metabolic panel     Status: None   Collection Time: 04/10/19  6:15 AM  Result Value Ref Range   Sodium 142 135 - 145 mmol/L   Potassium 3.6 3.5 - 5.1 mmol/L   Chloride 103 98 - 111 mmol/L   CO2 28 22 - 32 mmol/L   Glucose, Bld 85 70 - 99 mg/dL   BUN 9 6 - 20 mg/dL   Creatinine, Ser 0.89 0.44 - 1.00 mg/dL   Calcium 9.3 8.9 - 10.3 mg/dL   GFR calc non Af Amer >60 >60 mL/min   GFR calc Af Amer >60 >60 mL/min   Anion gap 11 5 - 15    Comment: Performed at [facility] [address] [place], [place] [address]  CBC WITH DIFFERENTIAL     Status: Abnormal   Collection Time: 04/10/19  6:15 AM  Result Value Ref Range   WBC 6.7 4.0 - 10.5 K/uL   RBC 4.00 3.87 - 5.11 MIL/uL   Hemoglobin 10.6 (L) 12.0 - 15.0 g/dL   HCT 32.7 (L) 36.0 - 46.0 %   MCV 81.8 80.0 - 100.0 fL   MCH 26.5 26.0 - 34.0 pg   MCHC 32.4 30.0 - 36.0 g/dL   RDW 11.6 11.5 - 15.5 %   Platelets 395 150 - 400 K/uL   nRBC 0.0 0.0 - 0.2 %   Neutrophils Relative % 68 %   Neutro Abs 4.5 1.7 - 7.7 K/uL   Lymphocytes Relative 24 %   Lymphs Abs 1.6 0.7 - 4.0 K/uL   Monocytes  Relative 6 %   Monocytes Absolute 0.4 0.1 - 1.0 K/uL   Eosinophils Relative 2 %   Eosinophils Absolute 0.2 0.0 - 0.5 K/uL   Basophils Relative 0 %   Basophils Absolute 0.0 0.0 - 0.1 K/uL   Immature Granulocytes 0 %   Abs Immature Granulocytes 0.01 0.00 - 0.07 K/uL    Comment: Performed at [facility], [address] [place],  [address]    Radiology/Results: CT ABDOMEN PELVIS W CONTRAST  Result Date: 04/09/2019 CLINICAL DATA:  Hysterectomy 03/28/2019 recent onset nausea, vomiting and diarrhea past 3 days now with abdominal pain and bloating. EXAM: CT ABDOMEN AND PELVIS WITH CONTRAST TECHNIQUE: Multidetector CT imaging of the abdomen and pelvis was performed using the standard protocol following bolus administration of intravenous contrast. CONTRAST:  146mL OMNIPAQUE IOHEXOL 300 MG/ML  SOLN COMPARISON:  None. FINDINGS: Lower chest: Lung bases are clear. Hepatobiliary: Evidence of moderate cholelithiasis with multiple low-density stones present. Liver and biliary tree are normal. Pancreas: Normal. Spleen: Normal. Adrenals/Urinary Tract: Adrenal glands are normal. Kidneys are normal in size without hydronephrosis or nephrolithiasis. Ureters and bladder are normal. Stomach/Bowel: Stomach is normal. Appendix is normal. Colon is decompressed. There multiple air and fluid-filled dilated small bowel loops measuring up to 3.4 cm in diameter. There are several areas of narrowing of small bowel and small bowel wall thickening over the lower abdomen/pelvis likely the cause of this small bowel obstruction and likely due to adhesions. There is no free peritoneal air. No evidence of pneumatosis. Mild-to-moderate associated free fluid in the pelvis. Vascular/Lymphatic: Normal. Reproductive: Evidence of patient's recent hysterectomy. Ovaries not definitely visualized. There is a well-defined  round 3.6 cm cystic structure in the right pelvis. This could represent an abscess versus peritoneal inclusion cyst  or ovarian remnant cyst. Other: None. Musculoskeletal: No acute findings. IMPRESSION: 1. Evidence of distal small bowel obstruction with several areas of small-bowel wall thickening and small bowel narrowing over the lower abdomen/pelvis likely the etiology of the obstruction and due to adhesions. Mild-to-moderate associated free pelvic fluid. 2. 3.6 cm round cystic structure over the right pelvis which may be due to postsurgical abscess, peritoneal inclusion cyst or ovarian cyst/ovarian remnant. Post hysterectomy. 3.  Cholelithiasis. Electronically Signed   By: Marin Olp M.D.   On: 04/09/2019 13:14   DG Abd Acute W/Chest  Result Date: 04/09/2019 CLINICAL DATA:  Abdominal pain.  Hysterectomy 03/28/2019 EXAM: DG ABDOMEN ACUTE W/ 1V CHEST COMPARISON:  None. FINDINGS: Normal heart size and mediastinal contours. No acute infiltrate or edema. No effusion or pneumothorax. No acute osseous findings. Dilated small bowel out of proportion to colon, with multiple fluid levels. No evidence of pneumoperitoneum. IMPRESSION: Diffuse small bowel dilatation with fluid levels. Although postoperative history suggests ileus there is no colonic distension and the pattern is more suggestive of small bowel obstruction. Suggest abdominal CT. Electronically Signed   By: Monte Fantasia M.D.   On: 04/09/2019 09:44   DG Abd Portable 1V-Small Bowel Obstruction Protocol-initial, 8 hr delay  Result Date: 04/10/2019 CLINICAL DATA:  8 hour delayed film for small bowel obstruction. EXAM: PORTABLE ABDOMEN - 1 VIEW COMPARISON:  04/09/2019 abdominal radiograph and CT abdomen/pelvis FINDINGS: Enteric tube terminates in the proximal stomach. Dilated small bowel loops throughout the abdomen measuring up to 4.0 cm diameter, not substantially changed. No evidence of pneumatosis or pneumoperitoneum. Apparent oral contrast within right abdominal small bowel loops. No definite colonic oral contrast. No evidence of radiopaque nephrolithiasis.  Excreted contrast seen in the bladder. IMPRESSION: Persistent dilated small bowel loops throughout the abdomen, not substantially changed, compatible with distal small bowel obstruction. Enteric tube terminates in the proximal stomach. Electronically Signed   By: Ilona Sorrel M.D.   On: 04/10/2019 04:15   DG Abd Portable 1V-Small Bowel Protocol-Position Verification  Result Date: 04/09/2019 CLINICAL DATA:  49 year old female with history of nasogastric tube placement. EXAM: PORTABLE ABDOMEN - 1 VIEW COMPARISON:  Chest x-ray 04/09/2019. FINDINGS: Nasogastric tube extending into the body of the stomach. Lung volumes are normal. No consolidative airspace disease. No pleural effusions. No pneumothorax. No pulmonary nodule or mass noted. Pulmonary vasculature and the cardiomediastinal silhouette are within normal limits. Multiple dilated loops of small bowel measuring up to 3.6 cm in diameter in the upper abdomen. IMPRESSION: 1.  No radiographic evidence of acute cardiopulmonary disease. 2. Nasogastric tube tip is in the body of the stomach. Multiple dilated loops of small bowel, concerning for small bowel obstruction. Electronically Signed   By: Vinnie Langton M.D.   On: 04/09/2019 18:05    Anti-infectives: Anti-infectives (From admission, onward)   None      Assessment/Plan: Problem List: Patient Active Problem List   Diagnosis Date Noted  . SBO (small bowel obstruction) ([place]) 04/09/2019  . Status post laparoscopic hysterectomy 03/28/2019  . Asthma 03/25/2017  . Knee pain 10/16/2016  . Low back pain 10/16/2016    Xray reviewed and proximal dilatation noted--would continue observation at present.  CCS will follow and repeat xray in the am.   * No surgery found *    LOS: 1 day   Matt B. Hassell Done, MD, [facility] [phone] beeper  [id number]  04/10/2019 9:55 AM

## 2019-04-10 NOTE — Progress Notes (Signed)
GYN Progress Note  S: Pain currently controlled, still with distended abdomen. Tried a few ice chips this morning and feeling a bit more abdominal pressure currently.  O:  Vital signs last 24 hours: Temp:  [98.4 F (36.9 C)-98.7 F (37.1 C)] 98.4 F (36.9 C) (02/21 0409) Pulse Rate:  [66-91] 84 (02/21 0409) Resp:  [17-20] 17 (02/21 0409) BP: (115-136)/(77-93) 123/80 (02/21 0409) SpO2:  [98 %-100 %] 98 % (02/21 0409)    I/O last 3 completed shifts: In: 1463.4 [P.O.:50; I.V.:1323.4; NG/GT:90] Out: 1150 [Urine:250; Emesis/NG output:900]   Physical Exam: BP 123/80 (BP Location: Right Arm)   Pulse 84   Temp 98.4 F (36.9 C) (Oral)   Resp 17   Ht 5\' 4"  (1.626 m)   LMP 03/20/2019 (Approximate)   SpO2 98%   BMI 23.86 kg/m   Gen: sitting up at bedside, NG emptying dark brownish gastric contents into canister, no acute distress, alert, oriented Abd: Mildly distended, appropriately tender Neuro: Steady upon standing up, able to ambulate    Lab Results  Component Value Date/Time   HGB 10.6 (L) 04/10/2019 06:15 AM   WBC 6.7 04/10/2019 06:15 AM   PLT 395 04/10/2019 06:15 AM   NA 142 04/10/2019 06:15 AM   K 3.6 04/10/2019 06:15 AM   BUN 9 04/10/2019 06:15 AM   Imaging: Abdominal X ray 04/09/2018 Persistent dilated small bowel loops throughout the abdomen, not substantially changed, compatible with distal small bowel obstruction. Enteric tube terminates in the proximal stomach.  Assessment: HD#1, POD#13 from [facility] with small bowel obstruction, pelvic fluid collection Plan: 1. SBO. Appreciate general surgery assistance with management. Continue inpatient admission for bowel rest and NG suction per recommendations Encouraged ambulation as tolerated Electrolytes wnl, I/O balanced, continue to follow I/Os closely especially keeping note of urine output, not charted this AM so I did notify her staff to continue tracking. LR at 125 ml/hr Pain control medications -  IV Toradol and  Dilaudid prn Recheck labs in AM  2. Pelvic fluid collection - no indication of abscess or systemic infection as she is afebrile with normal WBC count. Recommend pelvic ultrasound after discharge from hospital if not showing signs of infection during admission.   Joseph Pierini, MD [facility] 04/10/19

## 2019-04-10 NOTE — Plan of Care (Signed)
  Problem: Activity: Goal: Risk for activity intolerance will decrease Outcome: Progressing   Problem: Nutrition: Goal: Adequate nutrition will be maintained Outcome: Not Progressing   Problem: Elimination: Goal: Will not experience complications related to bowel motility Outcome: Not Progressing

## 2019-04-10 NOTE — Plan of Care (Signed)
  Problem: Education: Goal: Knowledge of General Education information will improve Description: Including pain rating scale, medication(s)/side effects and non-pharmacologic comfort measures Outcome: Progressing   Problem: Activity: Goal: Risk for activity intolerance will decrease Outcome: Progressing   

## 2019-04-10 NOTE — Plan of Care (Signed)

## 2019-04-11 ENCOUNTER — Inpatient Hospital Stay (HOSPITAL_COMMUNITY): Payer: BC Managed Care – PPO

## 2019-04-11 LAB — CBC WITH DIFFERENTIAL/PLATELET
Abs Immature Granulocytes: 0.01 10*3/uL (ref 0.00–0.07)
Basophils Absolute: 0 10*3/uL (ref 0.0–0.1)
Basophils Relative: 0 %
Eosinophils Absolute: 0.2 10*3/uL (ref 0.0–0.5)
Eosinophils Relative: 2 %
HCT: 29.9 % — ABNORMAL LOW (ref 36.0–46.0)
Hemoglobin: 9.6 g/dL — ABNORMAL LOW (ref 12.0–15.0)
Immature Granulocytes: 0 %
Lymphocytes Relative: 26 %
Lymphs Abs: 1.8 10*3/uL (ref 0.7–4.0)
MCH: 26.4 pg (ref 26.0–34.0)
MCHC: 32.1 g/dL (ref 30.0–36.0)
MCV: 82.1 fL (ref 80.0–100.0)
Monocytes Absolute: 0.4 10*3/uL (ref 0.1–1.0)
Monocytes Relative: 6 %
Neutro Abs: 4.7 10*3/uL (ref 1.7–7.7)
Neutrophils Relative %: 66 %
Platelets: 374 10*3/uL (ref 150–400)
RBC: 3.64 MIL/uL — ABNORMAL LOW (ref 3.87–5.11)
RDW: 11.6 % (ref 11.5–15.5)
WBC: 7 10*3/uL (ref 4.0–10.5)
nRBC: 0 % (ref 0.0–0.2)

## 2019-04-11 LAB — BASIC METABOLIC PANEL WITH GFR
Anion gap: 9 (ref 5–15)
BUN: 9 mg/dL (ref 6–20)
CO2: 27 mmol/L (ref 22–32)
Calcium: 8.9 mg/dL (ref 8.9–10.3)
Chloride: 104 mmol/L (ref 98–111)
Creatinine, Ser: 0.79 mg/dL (ref 0.44–1.00)
GFR calc Af Amer: >60 mL/min
GFR calc non Af Amer: >60 mL/min
Glucose, Bld: 83 mg/dL (ref 70–99)
Potassium: 3.5 mmol/L (ref 3.5–5.1)
Sodium: 140 mmol/L (ref 135–145)

## 2019-04-11 LAB — MAGNESIUM: Magnesium: 1.8 mg/dL (ref 1.7–2.4)

## 2019-04-11 LAB — PHOSPHORUS: Phosphorus: 3.7 mg/dL (ref 2.5–4.6)

## 2019-04-11 MED ORDER — POTASSIUM CHLORIDE CRYS ER 20 MEQ PO TBCR
20.0000 meq | EXTENDED_RELEASE_TABLET | Freq: Two times a day (BID) | ORAL | Status: DC
  Administered 2019-04-11: 15:00:00 20 meq via ORAL
  Filled 2019-04-11: qty 1

## 2019-04-11 MED ORDER — KCL IN DEXTROSE-NACL 40-5-0.9 MEQ/L-%-% IV SOLN
INTRAVENOUS | Status: DC
  Filled 2019-04-11 (×5): qty 1000

## 2019-04-11 MED ORDER — FAMOTIDINE IN NACL 20-0.9 MG/50ML-% IV SOLN
20.0000 mg | INTRAVENOUS | Status: DC
  Administered 2019-04-11 – 2019-04-13 (×3): 20 mg via INTRAVENOUS
  Filled 2019-04-11 (×3): qty 50

## 2019-04-11 NOTE — Progress Notes (Signed)
Subjective: Patient reports that her pain and distention are much improved. She is hungry, hates the NG tube. She is walking, passed a small amount of flatus last night. She has baseline issues with constipation.   Objective: I have reviewed patient's vital signs, intake and output and labs.  Today's Vitals   04/10/19 2009 04/10/19 2021 04/10/19 2300 04/11/19 0445  BP: 131/75   126/83  Pulse: 71   82  Resp: 16   16  Temp: 98.5 F (36.9 C)   98.6 F (37 C)  TempSrc: Oral   Oral  SpO2: 100%   100%  Height:   5\' 4"  (1.626 m)   PainSc:  2       I/O last 3 completed shifts: In: 4010.1 [P.O.:150; I.V.:3770.1; NG/GT:90] Out: 3150 [Urine:1350; Emesis/NG output:1800] No intake/output data recorded.   Lab Results  Component Value Date   WBC 7.0 04/11/2019   HGB 9.6 (L) 04/11/2019   HCT 29.9 (L) 04/11/2019   MCV 82.1 04/11/2019   PLT 374 04/11/2019  BMET Recent Labs (last 2 labs)       Recent Labs    04/10/19 0615 04/11/19 0214  NA 142 140  K 3.6 3.5  CL 103 104  CO2 28 27  GLUCOSE 85 83  BUN 9 9  CREATININE 0.89 0.79  CALCIUM 9.3 8.9     PT/INR Recent Labs (last 2 labs) [] Expand by Default  No results for input(s): LABPROT, INR in the last 72 hours.    Last Labs [] Expand by Default     Recent Labs  Lab 04/09/19 1101  AST 34  ALT 41  ALKPHOS 52  BILITOT 0.7  PROT 7.4  ALBUMIN 3.8          General: alert, cooperative and no distress Resp: clear to auscultation bilaterally Cardio: S1, S2 normal GI: soft, distended, +BS, not tender Extremities: extremities normal, atraumatic, no cyanosis or edema  EXAM: ABDOMEN - 2 VIEW  COMPARISON:  April 10, 2019.  FINDINGS: Distal tip of nasogastric tube is seen in the stomach. Stable small bowel dilatation is noted with air-fluid levels concerning for distal small bowel obstruction. Residual contrast is noted in the right colon. No free air is noted.  IMPRESSION: Stable small bowel dilatation is  noted with air-fluid levels concerning for distal small bowel obstruction.   Electronically Signed   By: Marijo Conception M.D.   On: 04/11/2019 08:20   Assessment/Plan: 2 weeks s/p TLH/RS/LO/extensive LOA and cystoscopy. She had severe bowel adhesions, Dr Hassell Done consulted during the surgery.  Admitted 2 days ago with SBO, being managed by General Surgery They are planning to clamp the NG tube and give her clear liquids  LOS: 2 days    Salvadore Dom 04/11/2019, 10:12 AM

## 2019-04-11 NOTE — Progress Notes (Signed)
Advanced NGT 5 cm per order.  Clamped NGT and started pt on sips of clear liquids.  Will continue to assess for any nausea and/or vomiting. Pt sitting in the rocking chair and rocking. States no abd pain at present.

## 2019-04-11 NOTE — Progress Notes (Signed)
Pt doing well without any nausea or vomiting. NGT still clamped.

## 2019-04-11 NOTE — Progress Notes (Signed)
Pt states she feels like a BM is right there at the end of her rectum but wont come out.  NGT has been clamped and reattached to low intermittent suction for 2 hours as ordered.  Pt has been taking clear liquids today.  Pt has been ambulating all day and rocking.  No c/o pain except for sore throat with the tube.

## 2019-04-11 NOTE — Progress Notes (Addendum)
    CC: Abdominal pain  Subjective: Patient says she feels much better.  She had 1 small bowel movement yesterday.  She reports she has chronic issues with her bowels.  She averages about 5 bowel movements per month and a lot of that is related to her menstrual cycle and sometimes if she eats something that does not agree with her.  Objective: Vital signs in last 24 hours: Temp:  [97.6 F (36.4 C)-98.6 F (37 C)] 98.6 F (37 C) (02/22 0445) Pulse Rate:  [65-82] 82 (02/22 0445) Resp:  [16] 16 (02/22 0445) BP: (119-131)/(73-83) 126/83 (02/22 0445) SpO2:  [100 %] 100 % (02/22 0445) Last BM Date: 04/07/19 100 p.o. 2446 IV 1100 urine 1250 NG Afebrile vital signs are stable K+ 3.5, mag 1.8, WBC 7.0 Hemoglobin 9.6, hematocrit 29.9. Two-view abdomen shows NG tube is in the stomach stable small bowel dilatation with air-fluid levels concerning for distal small bowel obstruction residual contrast is noted in the right colon Intake/Output from previous day: 02/21 0701 - 02/22 0700 In: 2546.7 [P.O.:100; I.V.:2446.7] Out: 2350 [Urine:1100; Emesis/NG output:1250] Intake/Output this shift: No intake/output data recorded.  General appearance: alert, cooperative and no distress Resp: clear to auscultation bilaterally Cardio: regular rate and rhythm, S1, S2 normal, no murmur, click, rub or gallop GI: Soft, she is sitting up in the chair she seems a little bit distended to me but she says is normal for her.  She has had 1 small bowel movement yesterday.  Some flatus. Extremities: extremities normal, atraumatic, no cyanosis or edema  Lab Results:  Recent Labs    04/10/19 0615 04/11/19 0214  WBC 6.7 7.0  HGB 10.6* 9.6*  HCT 32.7* 29.9*  PLT 395 374    BMET Recent Labs    04/10/19 0615 04/11/19 0214  NA 142 140  K 3.6 3.5  CL 103 104  CO2 28 27  GLUCOSE 85 83  BUN 9 9  CREATININE 0.89 0.79  CALCIUM 9.3 8.9   PT/INR No results for input(s): LABPROT, INR in the last 72  hours.  Recent Labs  Lab 04/09/19 1101  AST 34  ALT 41  ALKPHOS 52  BILITOT 0.7  PROT 7.4  ALBUMIN 3.8     Lipase     Component Value Date/Time   LIPASE 32 04/09/2019 1101     Medications: . mouth rinse  15 mL Mouth Rinse BID   . lactated ringers 125 mL/hr at 04/11/19 [id number]   Assessment/Plan Hx asthma Hx anxiety Hx migraine   SBO versus ileus S/p laparoscopic hysterectomy, right salpingoectomy, left oophorectomy, and extensive adhesiolysis, Dr. Sumner Boast  FEN: IV fluids/n.p.o. ID: None DVT: SCDs Follow-up: Dr. Talbert Nan   Plan: Start her on clamping trials with her NG and clear liquids; see how she does.  Continue to ambulate as much as possible.  With what sounds like chronic motility issues she may need to be placed high-fiber at some point, or see GI for possible motility issues.  Add KCl, try to get potassium 4.0 or higher.    LOS: 2 days    Angela Glover 04/11/2019 Please see Amion

## 2019-04-12 ENCOUNTER — Inpatient Hospital Stay (HOSPITAL_COMMUNITY): Payer: BC Managed Care – PPO

## 2019-04-12 LAB — BASIC METABOLIC PANEL
Anion gap: 10 (ref 5–15)
BUN: 6 mg/dL (ref 6–20)
CO2: 22 mmol/L (ref 22–32)
Calcium: 8.5 mg/dL — ABNORMAL LOW (ref 8.9–10.3)
Chloride: 108 mmol/L (ref 98–111)
Creatinine, Ser: 0.78 mg/dL (ref 0.44–1.00)
GFR calc Af Amer: >60 mL/min (ref >60)
GFR calc non Af Amer: >60 mL/min (ref >60)
Glucose, Bld: 130 mg/dL — ABNORMAL HIGH (ref 70–99)
Potassium: 3.6 mmol/L (ref 3.5–5.1)
Sodium: 140 mmol/L (ref 135–145)

## 2019-04-12 MED ORDER — ENOXAPARIN SODIUM 40 MG/0.4ML ~~LOC~~ SOLN
40.0000 mg | Freq: Every day | SUBCUTANEOUS | Status: DC
  Administered 2019-04-12 – 2019-04-13 (×2): 40 mg via SUBCUTANEOUS
  Filled 2019-04-12 (×2): qty 0.4

## 2019-04-12 MED ORDER — MENTHOL 3 MG MT LOZG: 1.0000 | LOZENGE | OROMUCOSAL | Status: DC | PRN

## 2019-04-12 NOTE — Progress Notes (Signed)
Subjective: Patient reports that she is feeling better. She had several BM's yesterday, has passed flatus. Tolerated clear liquids yesterday, no emesis, no current pain.  Objective: I have reviewed patient's vital signs, intake and output and labs.  Today's Vitals   04/11/19 2146 04/11/19 2215 04/12/19 0521 04/12/19 0827  BP: 114/90  (!) 144/96   Pulse: 74  77   Resp: 14  18   Temp: 99.9 F (37.7 C)  99.3 F (37.4 C)   TempSrc: Oral  Oral   SpO2: 100%  100%   Height:      PainSc:  0-No pain  0-No pain   I/O last 3 completed shifts: In: 5217.5 [P.O.:460; I.V.:4757.5] Out: 2400 [Urine:1100; Emesis/NG output:1300] No intake/output data recorded.      Ref Range & Units 01:02 1 d ago 2 d ago 3 d ago 2 wk ago  Sodium 135 - 145 mmol/L 140  140  142  139  140   Potassium 3.5 - 5.1 mmol/L 3.6  3.5  3.6  3.8  4.1   Chloride 98 - 111 mmol/L 108  104  103  101  110   CO2 22 - 32 mmol/L 22  27  28  25  24    Glucose, Bld 70 - 99 mg/dL 130High   83  85  103High   87   BUN 6 - 20 mg/dL 6  9  9  8  10    Creatinine, Ser 0.44 - 1.00 mg/dL 0.78  0.79  0.89  0.85  0.74   Calcium 8.9 - 10.3 mg/dL 8.5Low   8.9  9.3  10.0  9.1   GFR calc non Af Amer >60 mL/min >60  >60  >60  >60  >60   GFR calc Af Amer >60 mL/min >60  >60  >60  >60  >60   Anion gap 5 - 15 10  9  CM  11 CM  13 CM  6 CM   Comment: Performed at [facility], [address], [place], [place] [address]  Resulting Agency  [facility] [place] [facility] [place] [facility]      Specimen Collected: 04/12/19 01:02 Last Resulted: 04/12/19 01:59         General: alert, cooperative and no distress Resp: clear to auscultation bilaterally Cardio: S1, S2 normal GI: soft, minimally tender in her upper abdomen, distended, +BS Extremities: extremities normal, atraumatic, no cyanosis or edema   Assessment/Plan: 2 weeks s/p TLH/RS/LO/extensive LOA Admitted 3 days ago with SBO, NG tube intermittently clamped yesterday,  tolerating liquids, +BM, +flatus, still distended.  Management per General Surgery  LOS: 3 days    Salvadore Dom 04/12/2019, 9:21 AM

## 2019-04-12 NOTE — Progress Notes (Signed)
CC: Abdominal pain  Subjective: Patient went through clamping trials yesterday had minimal output through her NG with this.  She has had 2 bowel movements since yesterday.  The first was rather liquidy.  The second 1 had a little more solid formed to it.  She has just had some clear liquids and she feels a little full still.  No abdominal pain on palpation.  I hooked her back up to suction and did not get more than 50 cc from the NG tube this a.m. and this is after she had clear liquids for breakfast.  Objective: Vital signs in last 24 hours: Temp:  [98 F (36.7 C)-99.9 F (37.7 C)] 99.3 F (37.4 C) (02/23 0521) Pulse Rate:  [74-84] 77 (02/23 0521) Resp:  [14-18] 18 (02/23 0521) BP: (114-144)/(86-96) 144/96 (02/23 0521) SpO2:  [100 %] 100 % (02/23 0521) Last BM Date: 04/11/19 360 p.m. 2310 IV 700 from the NG(200 last shift) BM x2 T-max 99.9 K+ 3.6 Enteric tube terminates in the body of the stomach, well-positioned. 2. Persistent dilated central small bowel loops, not substantially changed, with oral contrast again noted in the right colon. Findings are compatible with partial distal small bowel obstruction versus postoperative adynamic ileus.  Intake/Output from previous day: 02/22 0701 - 02/23 0700 In: 2670.8 [P.O.:360; I.V.:2310.8] Out: 700 [Emesis/NG output:700] Intake/Output this shift: No intake/output data recorded.  General appearance: alert, cooperative and no distress Resp: clear to auscultation bilaterally Cardio: Regular rate and rhythm GI: Soft, bowel sounds are present.  She has had 2 bowel movements.  She still feels slightly distended after breakfast with clears. Extremities: extremities normal, atraumatic, no cyanosis or edema  Lab Results:  Recent Labs    04/10/19 0615 04/11/19 0214  WBC 6.7 7.0  HGB 10.6* 9.6*  HCT 32.7* 29.9*  PLT 395 374    BMET Recent Labs    04/11/19 0214 04/12/19 0102  NA 140 140  K 3.5 3.6  CL 104 108  CO2  27 22  GLUCOSE 83 130*  BUN 9 6  CREATININE 0.79 0.78  CALCIUM 8.9 8.5*   PT/INR No results for input(s): LABPROT, INR in the last 72 hours.  Recent Labs  Lab 04/09/19 1101  AST 34  ALT 41  ALKPHOS 52  BILITOT 0.7  PROT 7.4  ALBUMIN 3.8     Lipase     Component Value Date/Time   LIPASE 32 04/09/2019 1101     Medications: . mouth rinse  15 mL Mouth Rinse BID   . dextrose 5 % and 0.9 % NaCl with KCl 40 mEq/L 100 mL/hr at 04/11/19 1630  . famotidine (PEPCID) IV 20 mg (04/11/19 1415)  . lactated ringers 125 mL/hr at 04/11/19 [id number]   Assessment/Plan Hx asthma Hx anxiety Hx migraine  SBO versus ileus S/p laparoscopic hysterectomy, right salpingoectomy, left oophorectomy, and extensive adhesiolysis, Dr. Sumner Boast  FEN: IV fluids/clear liquids ID: None DVT: SCDs >> adding Lovenox Follow-up: Dr. Talbert Nan  Plan: She is doing well with her NG clamped, but her film still shows ongoing central small bowel loops that are distended up to 3.5 cm and not substantially changed.  I do not think she is obstructed I think this is mostly ileus.  I will leave her NG tube in but keep it clamped today.  Continue her on clear liquids today.  We tried oral potassium yesterday and made her throw up so I have it in her IV and I will continue the IV potassium  replacement.  Recheck labs in a.m.  Ask her to do IS q 1h.  LOS: 3 days    Dyllen Menning 04/12/2019 Please see Amion

## 2019-04-12 NOTE — Progress Notes (Signed)
ANTICOAGULATION CONSULT NOTE - Initial Consult  Pharmacy Consult for Lovenox Indication: VTE prophylaxis  Allergies  Allergen Reactions  . Oxycodone Itching  . Latex Rash    Burning    Patient Measurements: Height: 5\' 4"  (162.6 cm) IBW/kg (Calculated) : 54.7 Actual weight: 63 kg  Vital Signs: Temp: 99.3 F (37.4 C) (02/23 0521) Temp Source: Oral (02/23 0521) BP: 144/96 (02/23 0521) Pulse Rate: 77 (02/23 0521)  Labs: Recent Labs    04/09/19 1101 04/09/19 1101 04/10/19 0615 04/11/19 0214 04/12/19 0102  HGB 12.3   < > 10.6* 9.6*  --   HCT 37.9  --  32.7* 29.9*  --   PLT 429*  --  395 374  --   CREATININE 0.85   < > 0.89 0.79 0.78   < > = values in this interval not displayed.    Estimated Creatinine Clearance: 75.1 mL/min (by C-G formula based on SCr of 0.78 mg/dL).   Medical History: Past Medical History:  Diagnosis Date  . Abnormal Pap smear of cervix   . Anemia   . Anxiety   . Asthma    exercise induced  . Endometriosis   . Headache    migraines-occassionally when does not have glasses on  . Infertility, female     Medications:  Scheduled:  . mouth rinse  15 mL Mouth Rinse BID    Assessment: 49 yo F s/p hysterectomy ~ 2 weeks ago, returned to hospital with concern for SBO vs ilieus.  NG tube placed, tolerating clear liquids.  Surgery has consulted to start Lovenox for VTE ppx given patient's small size.  Of note, patient weighs 63kg.  SCr is good with calculated CrCl >30.  Would not dose adjust Lovenox unless patient weighs less than 45kg or CrCl < 30.  Goal of Therapy:  Anti-Xa level 0.3-0.6 units/ml 4hrs after LMWH dose given Monitor platelets by anticoagulation protocol: Yes   Plan:  Start Lovenox 40mg  SQ q24h (dose adjustment not needed). Pharmacy will sign off.   [employer], Pharm.D., BCPS Clinical Pharmacist  **Pharmacist phone directory can be found on [email or web address] listed under [place].  04/12/2019 10:55 AM

## 2019-04-13 ENCOUNTER — Inpatient Hospital Stay (HOSPITAL_COMMUNITY): Payer: BC Managed Care – PPO

## 2019-04-13 LAB — BASIC METABOLIC PANEL
Anion gap: 7 (ref 5–15)
BUN: <5 mg/dL — ABNORMAL LOW (ref 6–20)
CO2: 20 mmol/L — ABNORMAL LOW (ref 22–32)
Calcium: 8.5 mg/dL — ABNORMAL LOW (ref 8.9–10.3)
Chloride: 115 mmol/L — ABNORMAL HIGH (ref 98–111)
Creatinine, Ser: 0.66 mg/dL (ref 0.44–1.00)
GFR calc Af Amer: >60 mL/min (ref >60)
GFR calc non Af Amer: >60 mL/min (ref >60)
Glucose, Bld: 108 mg/dL — ABNORMAL HIGH (ref 70–99)
Potassium: 4.1 mmol/L (ref 3.5–5.1)
Sodium: 142 mmol/L (ref 135–145)

## 2019-04-13 LAB — CBC
HCT: 29 % — ABNORMAL LOW (ref 36.0–46.0)
Hemoglobin: 9.3 g/dL — ABNORMAL LOW (ref 12.0–15.0)
MCH: 26.1 pg (ref 26.0–34.0)
MCHC: 32.1 g/dL (ref 30.0–36.0)
MCV: 81.5 fL (ref 80.0–100.0)
Platelets: 372 10*3/uL (ref 150–400)
RBC: 3.56 MIL/uL — ABNORMAL LOW (ref 3.87–5.11)
RDW: 11.7 % (ref 11.5–15.5)
WBC: 6 10*3/uL (ref 4.0–10.5)
nRBC: 0 % (ref 0.0–0.2)

## 2019-04-13 LAB — MAGNESIUM: Magnesium: 1.7 mg/dL (ref 1.7–2.4)

## 2019-04-13 NOTE — Progress Notes (Signed)
The patient was seen at 7:30 am. Late entry note Subjective: Patient reports feeling well, no nausea or emesis. NG tube was clamped all day yesterday. +BM, +flatus, no pain, ambulating and voiding.      Objective: I have reviewed patient's vital signs, intake and output, labs and radiology results.  Today's Vitals   04/12/19 2104 04/13/19 0401 04/13/19 0520 04/13/19 0822  BP: (!) 143/96  (!) 135/93   Pulse: 74  73   Resp: 17  18   Temp: 99.2 F (37.3 C)  98.6 F (37 C)   TempSrc: Oral  Oral   SpO2: 100%  100%   Height:      PainSc: 0-No pain Asleep  0-No pain    I/O last 3 completed shifts: In: 2262.7 [P.O.:360; I.V.:1902.7] Out: 200 [Emesis/NG output:200] Total I/O In: 275 [P.O.:275] Out: -   CLINICAL DATA:  Ileus.  EXAM: ABDOMEN - 1 VIEW  COMPARISON:  April 12, 2019.  FINDINGS: Distal tip of nasogastric tube is seen in expected position of stomach. Mildly dilated small bowel loops are noted concerning for ileus or possibly distal small bowel obstruction. No colonic dilatation is noted. No radio-opaque calculi or other significant radiographic abnormality are seen.  IMPRESSION: Distal tip of nasogastric tube seen in expected position of stomach. Mildly dilated small bowel loops are noted concerning for ileus or possibly distal small bowel obstruction.   Electronically Signed   By: Marijo Conception M.D.   On: 04/13/2019 09:17   General: alert, cooperative and no distress Resp: clear to auscultation bilaterally Cardio: S1, S2 normal GI: soft, distended, not tender, +BS Extremities: extremities normal, atraumatic, no cyanosis or edema   Assessment/Plan: 2 weeks s/p TLH with extensive lysis of bowel adhesions, admitted 4 days ago with SBO. She is feeling better, still very distended but tolerating po, passing gas and flatus. Per General Surgery NG tube removed, may possibly go home later today.  LOS: 4 days    Salvadore Dom 04/13/2019,  12:20 PM

## 2019-04-13 NOTE — Progress Notes (Signed)
    CC: Abdominal pain  Subjective: Patient says she feels better.  She tolerated clamping all day yesterday without issue.  No nausea, some flatus she has had at least one BM.  She thinks she has had more.  She complains of the NG to be irritating her throat.  She still somewhat distended bowel sounds are present, somewhat high-pitched.  Objective: Vital signs in last 24 hours: Temp:  [98.6 F (37 C)-99.2 F (37.3 C)] 98.6 F (37 C) (02/24 0520) Pulse Rate:  [70-74] 73 (02/24 0520) Resp:  [17-18] 18 (02/24 0520) BP: (128-143)/(89-96) 135/93 (02/24 0520) SpO2:  [100 %] 100 % (02/24 0520) Last BM Date: 04/12/19 300 p.o. recorded 560 IV Urine x2 Stool x1 No NG output recorded Afebrile vital signs are stable K+ 4.1 WBC 6.0 A.m. film shows ongoing small bowel distention up to 3 cm.  Official read is not completed yet Intake/Output from previous day: 02/23 0701 - 02/24 0700 In: 860.8 [P.O.:300; I.V.:560.8] Out: -  Intake/Output this shift: No intake/output data recorded.  General appearance: alert, cooperative and no distress Resp: clear to auscultation bilaterally GI: Soft, she is still somewhat distended.  Bowel sounds are present high-pitched some flatus and stool.  Lab Results:  Recent Labs    04/11/19 0214 04/13/19 0109  WBC 7.0 6.0  HGB 9.6* 9.3*  HCT 29.9* 29.0*  PLT 374 372    BMET Recent Labs    04/12/19 0102 04/13/19 0109  NA 140 142  K 3.6 4.1  CL 108 115*  CO2 22 20*  GLUCOSE 130* 108*  BUN 6 <5*  CREATININE 0.78 0.66  CALCIUM 8.5* 8.5*   PT/INR No results for input(s): LABPROT, INR in the last 72 hours.  Recent Labs  Lab 04/09/19 1101  AST 34  ALT 41  ALKPHOS 52  BILITOT 0.7  PROT 7.4  ALBUMIN 3.8     Lipase     Component Value Date/Time   LIPASE 32 04/09/2019 1101     Medications: . enoxaparin (LOVENOX) injection  40 mg Subcutaneous Daily  . mouth rinse  15 mL Mouth Rinse BID    Assessment/Plan Hx asthma Hx  anxiety Hx migraine  SBO versus ileus S/p laparoscopic hysterectomy, right salpingoectomy, left oophorectomy, and extensive adhesiolysis,Dr.Jill Jertson  FEN: IV fluids/clear liquids ID: None DVT: SCDs/ Lovenox Follow-up: Dr. Talbert Nan   Plan: I put her back on suction and got less than 20 cc out of the NG tube.  I have irrigated and left her on suction.  We will review with Dr. Redmond Pulling.  LOS: 4 days    Bocephus Cali 04/13/2019 Please see Amion

## 2019-04-13 NOTE — Discharge Summary (Signed)
Physician Discharge Summary  Patient ID: Angela Glover MRN: [id number] DOB/AGE: 07-14-1970 49 y.o.  Admit date: 04/09/2019 Discharge date: 04/13/2019  Admission Diagnoses:  Discharge Diagnoses:  Active Problems:   SBO (small bowel obstruction) [facility])   Discharged Condition: good  Hospital Course: The patient was admitted with SBO, managed by General Surgery with NG tube. Yesterday tube was clamped and the patient tolerated a clear liquid diet, today NG tube was pulled and the patient has tolerated a full liquid diet. No nausea, emesis, +Flatus and BM.   Consults: general surgery  Significant Diagnostic Studies: labs: normal WBC and radiology: X-Ray: distention and CT scan: SBO  Treatments: NG tube  Discharge Exam: Blood pressure 109/76, pulse 81, temperature 98.5 F (36.9 C), temperature source Oral, resp. rate 15, height 5\' 4"  (1.626 m), last menstrual period 03/20/2019, SpO2 100 %. See full exam from this am.   Disposition: Discharge disposition: 01-Home or Self Care       Discharge Instructions    Call MD for:   Complete by: As directed    Nausea, vomiting   Call MD for:  difficulty breathing, headache or visual disturbances   Complete by: As directed    Call MD for:  extreme fatigue   Complete by: As directed    Call MD for:  hives   Complete by: As directed    Call MD for:  persistant dizziness or light-headedness   Complete by: As directed    Call MD for:  severe uncontrolled pain   Complete by: As directed    Call MD for:  temperature >100.4   Complete by: As directed    Diet full liquid   Complete by: As directed    full liquid for next several days to week. Drinking/eating small portions. Protein over less nutritious foods/liquids.   Increase activity slowly   Complete by: As directed      Allergies as of 04/13/2019      Reactions   Oxycodone Itching   Latex Rash   Burning      Medication List    STOP taking these medications    multivitamin with minerals Tabs tablet     TAKE these medications   acetaminophen 500 MG tablet Commonly known as: TYLENOL Take 2 tablets (1,000 mg total) by mouth every 6 (six) hours.   albuterol 108 (90 Base) MCG/ACT inhaler Commonly known as: VENTOLIN HFA Inhale 2 puffs into the lungs every 6 (six) hours as needed for wheezing or shortness of breath.   cholecalciferol 25 MCG (1000 UNIT) tablet Commonly known as: VITAMIN D3 Take 1,000 Units by mouth daily.   docusate sodium 100 MG capsule Commonly known as: COLACE Take 1 capsule (100 mg total) by mouth 2 (two) times daily.   ibuprofen 800 MG tablet Commonly known as: ADVIL Take 1 tablet (800 mg total) by mouth every 8 (eight) hours as needed.   magnesium oxide 400 MG tablet Commonly known as: MAG-OX Take 400 mg by mouth daily.   triamcinolone cream 0.1 % Commonly known as: KENALOG Apply 1 application topically daily as needed (itching).        Signed: Salvadore Dom 04/13/2019, 5:29 PM

## 2019-04-22 ENCOUNTER — Other Ambulatory Visit: Payer: Self-pay

## 2019-04-25 ENCOUNTER — Encounter: Payer: Self-pay | Admitting: Obstetrics and Gynecology

## 2019-04-25 ENCOUNTER — Ambulatory Visit (INDEPENDENT_AMBULATORY_CARE_PROVIDER_SITE_OTHER): Payer: BC Managed Care – PPO | Admitting: Obstetrics and Gynecology

## 2019-04-25 ENCOUNTER — Encounter: Payer: Self-pay | Admitting: Internal Medicine

## 2019-04-25 ENCOUNTER — Other Ambulatory Visit: Payer: Self-pay

## 2019-04-25 VITALS — BP 104/60 | HR 87 | Temp 97.0°F | Ht 64.25 in | Wt 134.8 lb

## 2019-04-25 DIAGNOSIS — R140 Abdominal distension (gaseous): Secondary | ICD-10-CM

## 2019-04-25 DIAGNOSIS — Z90710 Acquired absence of both cervix and uterus: Secondary | ICD-10-CM

## 2019-04-25 DIAGNOSIS — Z8719 Personal history of other diseases of the digestive system: Secondary | ICD-10-CM

## 2019-04-25 DIAGNOSIS — K5900 Constipation, unspecified: Secondary | ICD-10-CM

## 2019-04-25 NOTE — Progress Notes (Signed)
GYNECOLOGY  VISIT   HPI: 49 y.o.   Married Black or [place] American Not Hispanic or Latino  female   [phone] with Patient's last menstrual period was 03/20/2019 (approximate).   here for 4 week post op s/p TLH/RS/LO/extensive LOA. She was admitted 2 weeks post op with a SBO. She states that she is doing well. No bleeding to report.     Last week she started eating more normally, still being careful. Eating small amounts. She had a BM yesterday and today. Since she left the hospital the longest she has gone without a BM is 2 days. She lifelong h/o constipation and bloating.   GYNECOLOGIC HISTORY: Patient's last menstrual period was 03/20/2019 (approximate). Contraception:none  Menopausal hormone therapy: none        OB History    Gravida  2   Para  0   Term  0   Preterm  0   AB  2   Living  0     SAB  1   TAB      Ectopic  1   Multiple      Live Births                 Patient Active Problem List   Diagnosis Date Noted  . SBO (small bowel obstruction) ([place]) 04/09/2019  . Status post laparoscopic hysterectomy 03/28/2019  . Asthma 03/25/2017  . Knee pain 10/16/2016  . Low back pain 10/16/2016    Past Medical History:  Diagnosis Date  . Abnormal Pap smear of cervix   . Anemia   . Anxiety   . Asthma    exercise induced  . Endometriosis   . Headache    migraines-occassionally when does not have glasses on  . Infertility, female     Past Surgical History:  Procedure Laterality Date  . CYSTOSCOPY N/A 03/28/2019   Procedure: CYSTOSCOPY;  Surgeon: Salvadore Dom, MD;  [facility]: [facility];  Service: Gynecology;  Laterality: N/A;  . SALPINGECTOMY     left due to ectopic, laparotomy. Endometriosis noted.   Marland Kitchen TOTAL LAPAROSCOPIC HYSTERECTOMY WITH SALPINGECTOMY Bilateral 03/28/2019   Procedure: TOTAL LAPAROSCOPIC HYSTERECTOMY WITH RIGHT SALPINGECTOMY/LEFT OVARIAN CYSTECTOMY WITH LEFT OOPHERECTOMY;  Surgeon: Salvadore Dom, MD;  Location:  [place];  Service: Gynecology;  Laterality: Bilateral;  LEFT OVARIAN CYSTECTOMY WITH POSSIBLE LEFT OOPHERECTOMY/EXTENDED RECOVERY BED/3 hour surgery time/ks    Current Outpatient Medications  Medication Sig Dispense Refill  . acetaminophen (TYLENOL) 500 MG tablet Take 2 tablets (1,000 mg total) by mouth every 6 (six) hours. 30 tablet 0  . albuterol (VENTOLIN HFA) 108 (90 Base) MCG/ACT inhaler Inhale 2 puffs into the lungs every 6 (six) hours as needed for wheezing or shortness of breath.    . cholecalciferol (VITAMIN D3) 25 MCG (1000 UNIT) tablet Take 1,000 Units by mouth daily.    Marland Kitchen docusate sodium (COLACE) 100 MG capsule Take 1 capsule (100 mg total) by mouth 2 (two) times daily. 30 capsule 0  . ibuprofen (ADVIL) 800 MG tablet Take 1 tablet (800 mg total) by mouth every 8 (eight) hours as needed. 30 tablet 1  . magnesium oxide (MAG-OX) 400 MG tablet Take 400 mg by mouth daily.    Marland Kitchen triamcinolone cream (KENALOG) 0.1 % Apply 1 application topically daily as needed (itching).     No current facility-administered medications for this visit.     ALLERGIES: Oxycodone and Latex  Family History  Problem Relation Age of Onset  .  Cancer Maternal Aunt        colon  . Breast cancer Maternal Aunt        does not know age  . Cancer Maternal Grandmother        stomach  . Breast cancer Paternal Aunt     Social History   Socioeconomic History  . Marital status: Married    Spouse name: Not on file  . Number of children: Not on file  . Years of education: Not on file  . Highest education level: Not on file  Occupational History  . Not on file  Tobacco Use  . Smoking status: Never Smoker  . Smokeless tobacco: Never Used  Substance and Sexual Activity  . Alcohol use: No  . Drug use: No  . Sexual activity: Yes    Partners: Male    Birth control/protection: Pill  Other Topics Concern  . Not on file  Social History Narrative  . Not on file   Social Determinants of  Health   Financial Resource Strain:   . Difficulty of Paying Living Expenses: Not on file  Food Insecurity:   . Worried About Charity fundraiser in the Last Year: Not on file  . Ran Out of Food in the Last Year: Not on file  Transportation Needs:   . Lack of Transportation (Medical): Not on file  . Lack of Transportation (Non-Medical): Not on file  Physical Activity:   . Days of Exercise per Week: Not on file  . Minutes of Exercise per Session: Not on file  Stress:   . Feeling of Stress : Not on file  Social Connections:   . Frequency of Communication with Friends and Family: Not on file  . Frequency of Social Gatherings with Friends and Family: Not on file  . Attends Religious Services: Not on file  . Active Member of Clubs or Organizations: Not on file  . Attends Archivist Meetings: Not on file  . Marital Status: Not on file  Intimate Partner Violence:   . Fear of Current or Ex-Partner: Not on file  . Emotionally Abused: Not on file  . Physically Abused: Not on file  . Sexually Abused: Not on file    Review of Systems  All other systems reviewed and are negative.   PHYSICAL EXAMINATION:    BP 104/60   Pulse 87   Temp (!) 97 F (36.1 C)   Ht 5' 4.25" (1.632 m)   Wt 134 lb 12.8 oz (61.1 kg)   LMP 03/20/2019 (Approximate)   SpO2 99%   BMI 22.96 kg/m     General appearance: alert, cooperative and appears stated age Abdomen: soft, non-tender; moderately distended, no masses,  no organomegaly Incisions: well healed  Pelvic: External genitalia:  no lesions              Urethra:  normal appearing urethra with no masses, tenderness or lesions              Bartholins and Skenes: normal                 Vagina: normal appearing vagina with normal color and discharge, no lesions. Vaginal cuff healing well, not tender              Cervix: absent              Bimanual Exam:  Uterus:  uterus absent              Adnexa:  no mass, fullness, tenderness                 Chaperone was present for exam.  ASSESSMENT S/P TLH/RS/LO and extensive lysis of bowel adhesions from the abdominal wall and pelvis.  She has lifelong issues with constipation and bloating Admitted 2 weeks post op with a SBO    PLAN Doing well Will have her establish care with Dr Carlean Purl for her GI issues.    An After Visit Summary was printed and given to the patient.

## 2019-05-09 ENCOUNTER — Encounter: Payer: Self-pay | Admitting: Certified Nurse Midwife

## 2019-06-02 ENCOUNTER — Encounter: Payer: Self-pay | Admitting: Internal Medicine

## 2019-06-06 ENCOUNTER — Ambulatory Visit: Payer: BC Managed Care – PPO | Admitting: Internal Medicine

## 2019-07-21 ENCOUNTER — Other Ambulatory Visit (INDEPENDENT_AMBULATORY_CARE_PROVIDER_SITE_OTHER): Payer: BC Managed Care – PPO

## 2019-07-21 ENCOUNTER — Encounter: Payer: Self-pay | Admitting: Internal Medicine

## 2019-07-21 ENCOUNTER — Ambulatory Visit: Payer: BC Managed Care – PPO | Admitting: Internal Medicine

## 2019-07-21 VITALS — BP 100/70 | HR 80 | Ht 64.25 in | Wt 139.4 lb

## 2019-07-21 DIAGNOSIS — Z1211 Encounter for screening for malignant neoplasm of colon: Secondary | ICD-10-CM | POA: Diagnosis not present

## 2019-07-21 DIAGNOSIS — D649 Anemia, unspecified: Secondary | ICD-10-CM

## 2019-07-21 DIAGNOSIS — K5909 Other constipation: Secondary | ICD-10-CM

## 2019-07-21 LAB — CBC WITH DIFFERENTIAL/PLATELET
Basophils Absolute: 0 10*3/uL (ref 0.0–0.1)
Basophils Relative: 0.5 % (ref 0.0–3.0)
Eosinophils Absolute: 0.1 10*3/uL (ref 0.0–0.7)
Eosinophils Relative: 2.3 % (ref 0.0–5.0)
HCT: 38.2 % (ref 36.0–46.0)
Hemoglobin: 12.5 g/dL (ref 12.0–15.0)
Lymphocytes Relative: 29.2 % (ref 12.0–46.0)
Lymphs Abs: 1.6 10*3/uL (ref 0.7–4.0)
MCHC: 32.6 g/dL (ref 30.0–36.0)
MCV: 80.3 fl (ref 78.0–100.0)
Monocytes Absolute: 0.4 10*3/uL (ref 0.1–1.0)
Monocytes Relative: 7.6 % (ref 3.0–12.0)
Neutro Abs: 3.3 10*3/uL (ref 1.4–7.7)
Neutrophils Relative %: 60.4 % (ref 43.0–77.0)
Platelets: 354 10*3/uL (ref 150.0–400.0)
RBC: 4.76 Mil/uL (ref 3.87–5.11)
RDW: 13.5 % (ref 11.5–15.5)
WBC: 5.5 10*3/uL (ref 4.0–10.5)

## 2019-07-21 NOTE — Patient Instructions (Signed)
Normal BMI (Body Mass Index- based on height and weight) is between 19 and 25. Your BMI today is Body mass index is 23.74 kg/m. Marland Kitchen Please consider follow up  regarding your BMI with your Primary Care Provider.   You have been scheduled for a colonoscopy. Please follow written instructions given to you at your visit today.  Please pick up your prep supplies at the pharmacy within the next 1-3 days. If you use inhalers (even only as needed), please bring them with you on the day of your procedure.   Your provider has requested that you go to the basement level for lab work before leaving today. Press "B" on the elevator. The lab is located at the first door on the left as you exit the elevator.   I appreciate the opportunity to care for you. Silvano Rusk, MD, [facility]

## 2019-07-21 NOTE — Progress Notes (Signed)
Angela Glover 49 y.o. 01/25/1971 [id number] Referred by: Salvadore Dom, MD  Assessment & Plan:   Encounter Diagnoses  Name Primary?  . Chronic constipation Yes  . Mild anemia   . Colon cancer screening    Recheck CBC I suspect her anemia might have been delusional but want to be sure  Her chronic constipation does not affect her quality of life so I would not do anything different  We discussed the various options for colon cancer screening that include colonoscopy Hemoccult testing and Cologuard testing and she has decided to perform a colonoscopy.  We will schedule that.  Endoscopy risks please  I will send a copy to Dr. Talbert Nan   Subjective:   Chief Complaint: Bloating and constipation  HPI The patient is a 49 year old married African-American woman referred by Dr. Talbert Nan for evaluation of bloating and constipation.  The patient had a total laparoscopic hysterectomy with salpingectomy in February of this year and then had a small bowel obstruction managed nonoperatively soon after that.  I think a couple of weeks afterwards.  In follow-up in March it was discussed that she had had some bloating and she is always had chronic constipation moving her bowels maybe 7 or 8 times a month now she is actually improved with defecation 2-3 times a week.  She said she was checked out as a child and "they did not find anything wrong" and she really has never been bothered with significant abdominal distress or anything related to the infrequent defecation.  Her hemoglobin was 9 at hospital discharge having been normal before.  She is not having any bleeding or other signs or symptoms like that.  She does feel okay denies significant fatigue she is returned to work etc.  She has never had a colonoscopy.  There is no family history of colon cancer. Allergies  Allergen Reactions  . Oxycodone Itching  . Latex Rash    Burning   Current Meds  Medication Sig  .  acetaminophen (TYLENOL) 500 MG tablet Take 2 tablets (1,000 mg total) by mouth every 6 (six) hours.  Marland Kitchen albuterol (VENTOLIN HFA) 108 (90 Base) MCG/ACT inhaler Inhale 2 puffs into the lungs every 6 (six) hours as needed for wheezing or shortness of breath.  . cholecalciferol (VITAMIN D3) 25 MCG (1000 UNIT) tablet Take 1,000 Units by mouth daily.  . fluticasone (FLONASE) 50 MCG/ACT nasal spray Place 1 spray into both nostrils daily.  . hydrOXYzine (ATARAX/VISTARIL) 25 MG tablet Take 25 mg by mouth 3 (three) times daily as needed.  . triamcinolone cream (KENALOG) 0.1 % Apply 1 application topically daily as needed (itching).   Past Medical History:  Diagnosis Date  . Abnormal Pap smear of cervix   . Anemia   . Anxiety   . Asthma    exercise induced  . Endometriosis   . Headache    migraines-occassionally when does not have glasses on  . Infertility, female   . Small bowel obstruction [facility])    Past Surgical History:  Procedure Laterality Date  . CYSTOSCOPY N/A 03/28/2019   Procedure: CYSTOSCOPY;  Surgeon: Salvadore Dom, MD;  Location: [facility];  Service: Gynecology;  Laterality: N/A;  . SALPINGECTOMY     left due to ectopic, laparotomy. Endometriosis noted.   . SMALL INTESTINE SURGERY    . TOTAL LAPAROSCOPIC HYSTERECTOMY WITH SALPINGECTOMY Bilateral 03/28/2019   Procedure: TOTAL LAPAROSCOPIC HYSTERECTOMY WITH RIGHT SALPINGECTOMY/LEFT OVARIAN CYSTECTOMY WITH LEFT OOPHERECTOMY;  Surgeon: Salvadore Dom,  MD;  Location: [place];  Service: Gynecology;  Laterality: Bilateral;  LEFT OVARIAN CYSTECTOMY WITH POSSIBLE LEFT OOPHERECTOMY/EXTENDED RECOVERY BED/3 hour surgery time/ks   Social History   Social History Narrative   The patient is married, no children.  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   She does not use alcohol she has never smoked or used tobacco and she  does not use drugs.   family history includes Breast cancer in her maternal aunt and paternal aunt; Colon cancer in her maternal aunt; Stomach cancer in her maternal grandmother.   Review of Systems As per HPI  Objective:   Physical Exam BP 100/70   Pulse 80   Ht 5' 4.25" (1.632 m)   Wt 139 lb 6.4 oz (63.2 kg)   LMP 03/20/2019 (Approximate)   BMI 23.74 kg/m  NAD Anicteric Lungs cta Cor NL abd soft NT no HSM/mass BS + Alert and oriented x 3

## 2019-08-16 ENCOUNTER — Encounter: Payer: BC Managed Care – PPO | Admitting: Internal Medicine

## 2019-08-17 DIAGNOSIS — H5203 Hypermetropia, bilateral: Secondary | ICD-10-CM | POA: Diagnosis not present

## 2019-08-23 ENCOUNTER — Encounter: Payer: Self-pay | Admitting: Internal Medicine

## 2019-08-26 ENCOUNTER — Encounter: Payer: Self-pay | Admitting: Internal Medicine

## 2019-08-26 ENCOUNTER — Other Ambulatory Visit: Payer: Self-pay

## 2019-08-26 ENCOUNTER — Ambulatory Visit (AMBULATORY_SURGERY_CENTER): Payer: BC Managed Care – PPO | Admitting: Internal Medicine

## 2019-08-26 VITALS — BP 119/72 | HR 78 | Temp 97.1°F | Resp 13 | Ht 64.25 in | Wt 139.0 lb

## 2019-08-26 DIAGNOSIS — Z1211 Encounter for screening for malignant neoplasm of colon: Secondary | ICD-10-CM

## 2019-08-26 MED ORDER — SODIUM CHLORIDE 0.9 % IV SOLN: 500.0000 mL | Freq: Once | INTRAVENOUS | Status: DC

## 2019-08-26 NOTE — Op Note (Signed)
Angela Glover Patient Name: Angela Glover Procedure Date: 08/26/2019 3:55 PM MRN: [id number] Endoscopist: Gatha Mayer , MD Age: 49 Referring MD:  Date of Birth: Feb 27, 1970 Gender: Female Account #: [id number] Procedure:                Colonoscopy Indications:              Screening for colorectal malignant neoplasm Medicines:                Propofol per Anesthesia, Monitored Anesthesia Care Procedure:                Pre-Anesthesia Assessment:                           - Prior to the procedure, a History and Physical                            was performed, and patient medications and                            allergies were reviewed. The patient's tolerance of                            previous anesthesia was also reviewed. The risks                            and benefits of the procedure and the sedation                            options and risks were discussed with the patient.                            All questions were answered, and informed consent                            was obtained. Prior Anticoagulants: The patient has                            taken no previous anticoagulant or antiplatelet                            agents. ASA Grade Assessment: II - A patient with                            mild systemic disease. After reviewing the risks                            and benefits, the patient was deemed in                            satisfactory condition to undergo the procedure.                           After obtaining informed consent, the colonoscope  was passed under direct vision. Throughout the                            procedure, the patient's blood pressure, pulse, and                            oxygen saturations were monitored continuously. The                            Colonoscope was introduced through the anus and                            advanced to the the cecum, identified by                             appendiceal orifice and ileocecal valve. The                            quality of the bowel preparation was good. The                            colonoscopy was performed without difficulty. The                            patient tolerated the procedure well. The bowel                            preparation used was Miralax via split dose                            instruction. Scope In: 4:09:00 PM Scope Out: 4:25:21 PM Scope Withdrawal Time: 0 hours 11 minutes 35 seconds  Total Procedure Duration: 0 hours 16 minutes 21 seconds  Findings:                 The perianal and digital rectal examinations were                            normal.                           The colon (entire examined portion) appeared normal. Complications:            No immediate complications. Estimated blood loss:                            None. Estimated Blood Loss:     Estimated blood loss: none. Impression:               - The entire examined colon is normal. Unable to                            retroflex in rectum - small rectal vault.                           - No specimens collected. Recommendation:           -  Repeat colonoscopy in 10 years for screening                            purposes.                           - Patient has a contact number available for                            emergencies. The signs and symptoms of potential                            delayed complications were discussed with the                            patient. Return to normal activities tomorrow.                            Written discharge instructions were provided to the                            patient.                           - Resume previous diet.                           - Continue present medications. Gatha Mayer, MD 08/26/2019 4:33:20 PM This report has been signed electronically.

## 2019-08-26 NOTE — Patient Instructions (Addendum)
Your colonoscopy was normal - no polyps, no cancer seen.  Next routine colonoscopy or other screening test in 10 years - 2031  I appreciate the opportunity to care for you. Gatha Mayer, MD, FACG  YOU HAD AN ENDOSCOPIC PROCEDURE TODAY AT [facility]:   Refer to the procedure report that was given to you for any specific questions about what was found during the examination.  If the procedure report does not answer your questions, please call your gastroenterologist to clarify.  If you requested that your care partner not be given the details of your procedure findings, then the procedure report has been included in a sealed envelope for you to review at your convenience later.  YOU SHOULD EXPECT: Some feelings of bloating in the abdomen. Passage of more gas than usual.  Walking can help get rid of the air that was put into your GI tract during the procedure and reduce the bloating. If you had a lower endoscopy (such as a colonoscopy or flexible sigmoidoscopy) you may notice spotting of blood in your stool or on the toilet paper. If you underwent a bowel prep for your procedure, you may not have a normal bowel movement for a few days.  Please Note:  You might notice some irritation and congestion in your nose or some drainage.  This is from the oxygen used during your procedure.  There is no need for concern and it should clear up in a day or so.  SYMPTOMS TO REPORT IMMEDIATELY:   Following lower endoscopy (colonoscopy or flexible sigmoidoscopy):  Excessive amounts of blood in the stool  Significant tenderness or worsening of abdominal pains  Swelling of the abdomen that is new, acute  Fever of 100F or higher  For urgent or emergent issues, a gastroenterologist can be reached at any hour by calling [phone]. Do not use MyChart messaging for urgent concerns.    DIET:  We do recommend a small meal at first, but then you may proceed to your regular diet.  Drink plenty  of fluids but you should avoid alcoholic beverages for 24 hours.  ACTIVITY:  You should plan to take it easy for the rest of today and you should NOT DRIVE or use heavy machinery until tomorrow (because of the sedation medicines used during the test).    FOLLOW UP: Our staff will call the number listed on your records 48-72 hours following your procedure to check on you and address any questions or concerns that you may have regarding the information given to you following your procedure. If we do not reach you, we will leave a message.  We will attempt to reach you two times.  During this call, we will ask if you have developed any symptoms of COVID 19. If you develop any symptoms (ie: fever, flu-like symptoms, shortness of breath, cough etc.) before then, please call [phone].  If you test positive for Covid 19 in the 2 weeks post procedure, please call and report this information to [place].    If any biopsies were taken you will be contacted by phone or by letter within the next 1-3 weeks.  Please call us at [phone] if you have not heard about the biopsies in 3 weeks.    SIGNATURES/CONFIDENTIALITY: You and/or your care partner have signed paperwork which will be entered into your electronic medical record.  These signatures attest to the fact that that the information above on your After Visit Summary has been reviewed  and is understood.  Full responsibility of the confidentiality of this discharge information lies with you and/or your care-partner. 

## 2019-08-26 NOTE — Progress Notes (Signed)
pt tolerated well. VSS. awake and to recovery. Report given to RN.  

## 2019-08-30 ENCOUNTER — Telehealth: Payer: Self-pay

## 2019-08-30 NOTE — Telephone Encounter (Signed)
  Follow up Call-  Call back number 08/26/2019  Post procedure Call Back phone  # [phone]  Permission to leave phone message Yes  Some recent data might be hidden     Patient questions:  Do you have a fever, pain , or abdominal swelling? No. Pain Score  0 *  Have you tolerated food without any problems? Yes.    Have you been able to return to your normal activities? Yes.    Do you have any questions about your discharge instructions: Diet   No. Medications  No. Follow up visit  No.  Do you have questions or concerns about your Care? No.  Actions: * If pain score is 4 or above: No action needed, pain <4. 1. Have you developed a fever since your procedure? no  2.   Have you had an respiratory symptoms (SOB or cough) since your procedure? no  3.   Have you tested positive for COVID 19 since your procedure no  4.   Have you had any family members/close contacts diagnosed with the COVID 19 since your procedure?  no   If yes to any of these questions please route to Joylene John, RN and Erenest Rasher, RN

## 2019-10-25 ENCOUNTER — Encounter: Payer: Self-pay | Admitting: Emergency Medicine

## 2019-10-25 ENCOUNTER — Other Ambulatory Visit: Payer: Self-pay

## 2019-10-25 ENCOUNTER — Ambulatory Visit
Admission: EM | Admit: 2019-10-25 | Discharge: 2019-10-25 | Disposition: A | Discharge status: Home / Self Care | Payer: BC Managed Care – PPO | Attending: Emergency Medicine | Admitting: Emergency Medicine

## 2019-10-25 DIAGNOSIS — S66911A Strain of unspecified muscle, fascia and tendon at wrist and hand level, right hand, initial encounter: Secondary | ICD-10-CM | POA: Diagnosis not present

## 2019-10-25 MED ORDER — NAPROXEN 500 MG PO TABS: 500.0000 mg | ORAL_TABLET | Freq: Two times a day (BID) | ORAL | 0 refills | Status: DC

## 2019-10-25 NOTE — ED Triage Notes (Signed)
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

## 2019-10-25 NOTE — Discharge Instructions (Addendum)
RICE: rest, ice, compression, elevation as needed for pain.   Cold therapy (ice packs) can be used to help swelling both after injury and after prolonged use of areas of chronic pain/aches.  Pain medication:  500 mg Naprosyn/Aleve (naproxen) every 12 hours with food:  AVOID other NSAIDs while taking this (may have Tylenol).  Important to follow up with specialist(s) below for further evaluation/management if your symptoms persist or worsen.

## 2019-10-25 NOTE — ED Provider Notes (Signed)
[facility]    CSN: [id number] Arrival date & time: 10/25/19  1660      History   Chief Complaint Chief Complaint  Patient presents with  . Wrist Pain    HPI Angela Glover is a 49 y.o. female  Presenting for right wrist strain.  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  Felt a popping sensation: Denies traumatic injury, fall.  No numbness or tingling, weakness.  Bought a wrist brace at [facility] which helped with pain.  Past Medical History:  Diagnosis Date  . Abnormal Pap smear of cervix   . Anemia   . Anxiety   . Asthma    exercise induced  . Endometriosis   . Headache    migraines-occassionally when does not have glasses on  . Infertility, female   . Small bowel obstruction [facility])     Patient Active Problem List   Diagnosis Date Noted  . SBO (small bowel obstruction) ([place]) 04/09/2019  . Status post laparoscopic hysterectomy 03/28/2019  . Asthma 03/25/2017  . Knee pain 10/16/2016  . Low back pain 10/16/2016    Past Surgical History:  Procedure Laterality Date  . CYSTOSCOPY N/A 03/28/2019   Procedure: CYSTOSCOPY;  Surgeon: Salvadore Dom, MD;  Location: [facility];  Service: Gynecology;  Laterality: N/A;  . SALPINGECTOMY     left due to ectopic, laparotomy. Endometriosis noted.   . SMALL INTESTINE SURGERY    . TOTAL LAPAROSCOPIC HYSTERECTOMY WITH SALPINGECTOMY Bilateral 03/28/2019   Procedure: TOTAL LAPAROSCOPIC HYSTERECTOMY WITH RIGHT SALPINGECTOMY/LEFT OVARIAN CYSTECTOMY WITH LEFT OOPHERECTOMY;  Surgeon: Salvadore Dom, MD;  Location: [place];  Service: Gynecology;  Laterality: Bilateral;  LEFT OVARIAN CYSTECTOMY WITH POSSIBLE LEFT OOPHERECTOMY/EXTENDED RECOVERY BED/3 hour surgery time/ks    OB History    Gravida  2   Para  0   Term  0   Preterm  0   AB  2   Living  0     SAB  1   TAB      Ectopic  1   Multiple      Live Births               Home Medications     Prior to Admission medications   Medication Sig Start Date End Date Taking? Authorizing Provider  acetaminophen (TYLENOL) 500 MG tablet Take 2 tablets (1,000 mg total) by mouth every 6 (six) hours. 03/29/19   Salvadore Dom, MD  albuterol (VENTOLIN HFA) 108 (90 Base) MCG/ACT inhaler Inhale 2 puffs into the lungs every 6 (six) hours as needed for wheezing or shortness of breath.    [provider]  cholecalciferol (VITAMIN D3) 25 MCG (1000 UNIT) tablet Take 1,000 Units by mouth daily.    [provider]  fluticasone (FLONASE) 50 MCG/ACT nasal spray Place 1 spray into both nostrils daily.    [provider]  hydrOXYzine (ATARAX/VISTARIL) 25 MG tablet Take 25 mg by mouth 3 (three) times daily as needed.    [provider]  naproxen (NAPROSYN) 500 MG tablet Take 1 tablet (500 mg total) by mouth 2 (two) times daily. 10/25/19   Hall-Potvin, [place], PA-C  triamcinolone cream (KENALOG) 0.1 % Apply 1 application topically daily as needed (itching).    [provider]    Family History Family History  Problem Relation Age of Onset  . Breast cancer Maternal Aunt        does not know age  .  Colon cancer Maternal Aunt        dx in late 47s  . Stomach cancer Maternal Grandmother        around 87 at dx  . Breast cancer Paternal Aunt   . Esophageal cancer Neg Hx   . Pancreatic cancer Neg Hx   . Liver cancer Neg Hx     Social History Social History   Tobacco Use  . Smoking status: Never Smoker  . Smokeless tobacco: Never Used  Vaping Use  . Vaping Use: Never used  Substance Use Topics  . Alcohol use: No  . Drug use: No     Allergies   Oxycodone and Latex   Review of Systems As per HPI   Physical Exam Triage Vital Signs ED Triage Vitals  Enc Vitals Group     BP      Pulse      Resp      Temp      Temp src      SpO2      Weight      Height      Head Circumference      Peak Flow      Pain Score      Pain Loc      Pain  Edu?      Excl. in [place]?    No data found.  Updated Vital Signs BP 134/89 (BP Location: Right Arm)   Pulse 82   Temp 98.6 F (37 C) (Oral)   Resp 18   LMP 03/20/2019 (Approximate)   SpO2 97%   Visual Acuity Right Eye Distance:   Left Eye Distance:   Bilateral Distance:    Right Eye Near:   Left Eye Near:    Bilateral Near:     Physical Exam Constitutional:      General: She is not in acute distress. HENT:     Head: Normocephalic and atraumatic.  Eyes:     General: No scleral icterus.    Pupils: Pupils are equal, round, and reactive to light.  Cardiovascular:     Rate and Rhythm: Normal rate.  Pulmonary:     Effort: Pulmonary effort is normal.  Musculoskeletal:        General: Swelling and tenderness present. Normal range of motion.     Comments: Right wrist mildly swollen as compared to left.  Neurovascular intact.  Negative Tinel's, Phalen's test.  Positive Finkelstein's test.  Pain worse with supination.  Skin:    Coloration: Skin is not jaundiced or pale.  Neurological:     Mental Status: She is alert and oriented to person, place, and time.      UC Treatments / Results  Labs (all labs ordered are listed, but only abnormal results are displayed) Labs Reviewed - No data to display  EKG   Radiology No results found.  Procedures Procedures (including critical care time)  Medications Ordered in UC Medications - No data to display  Initial Impression / Assessment and Plan / UC Course  I have reviewed the triage vital signs and the nursing notes.  Pertinent labs & imaging results that were available during my care of the patient were reviewed by me and considered in my medical decision making (see chart for details).     No bony deformity or tenderness, high impact injury: Radiography deferred.  Likely wrist strain/tendinitis given line of work.  Encourage patient to wear wrist brace, practice RICE.  Work note and orthopedic contact information  provided.  Return precautions discussed, pt verbalized understanding and is agreeable to plan. Final Clinical Impressions(s) / UC Diagnoses   Final diagnoses:  Strain of right wrist, initial encounter     Discharge Instructions     RICE: rest, ice, compression, elevation as needed for pain.   Cold therapy (ice packs) can be used to help swelling both after injury and after prolonged use of areas of chronic pain/aches.  Pain medication:  500 mg Naprosyn/Aleve (naproxen) every 12 hours with food:  AVOID other NSAIDs while taking this (may have Tylenol).  Important to follow up with specialist(s) below for further evaluation/management if your symptoms persist or worsen.    ED Prescriptions    Medication Sig Dispense Auth. Provider   naproxen (NAPROSYN) 500 MG tablet Take 1 tablet (500 mg total) by mouth 2 (two) times daily. 30 tablet Hall-Potvin, [place], PA-C     PDMP not reviewed this encounter.   Hall-Potvin, [place], [place] 10/25/19 [phone]

## 2019-11-10 DIAGNOSIS — M25531 Pain in right wrist: Secondary | ICD-10-CM | POA: Diagnosis not present

## 2019-12-01 ENCOUNTER — Other Ambulatory Visit: Payer: Self-pay

## 2019-12-01 ENCOUNTER — Ambulatory Visit: Payer: BC Managed Care – PPO | Admitting: Obstetrics and Gynecology

## 2019-12-01 ENCOUNTER — Encounter: Payer: Self-pay | Admitting: Obstetrics and Gynecology

## 2019-12-01 VITALS — BP 133/72 | HR 87 | Ht 64.0 in | Wt 143.0 lb

## 2019-12-01 DIAGNOSIS — Z90710 Acquired absence of both cervix and uterus: Secondary | ICD-10-CM

## 2019-12-01 DIAGNOSIS — Z01419 Encounter for gynecological examination (general) (routine) without abnormal findings: Secondary | ICD-10-CM

## 2019-12-01 NOTE — Patient Instructions (Signed)
EXERCISE AND DIET:  We recommended that you start or continue a regular exercise program for good health. Regular exercise means any activity that makes your heart beat faster and makes you sweat.  We recommend exercising at least 30 minutes per day at least 3 days a week, preferably 4 or 5.  We also recommend a diet low in fat and sugar.  Inactivity, poor dietary choices and obesity can cause diabetes, heart attack, stroke, and kidney damage, among others.    ALCOHOL AND SMOKING:  Women should limit their alcohol intake to no more than 7 drinks/beers/glasses of wine (combined, not each!) per week. Moderation of alcohol intake to this level decreases your risk of breast cancer and liver damage. And of course, no recreational drugs are part of a healthy lifestyle.  And absolutely no smoking or even second hand smoke. Most people know smoking can cause heart and lung diseases, but did you know it also contributes to weakening of your bones? Aging of your skin?  Yellowing of your teeth and nails?  CALCIUM AND VITAMIN D:  Adequate intake of calcium and Vitamin D are recommended.  The recommendations for exact amounts of these supplements seem to change often, but generally speaking 1,000 mg of calcium (between diet and supplement) and 800 units of Vitamin D per day seems prudent. Certain women may benefit from higher intake of Vitamin D.  If you are among these women, your doctor will have told you during your visit.    PAP SMEARS:  Pap smears, to check for cervical cancer or precancers,  have traditionally been done yearly, although recent scientific advances have shown that most women can have pap smears less often.  However, every woman still should have a physical exam from her gynecologist every year. It will include a breast check, inspection of the vulva and vagina to check for abnormal growths or skin changes, a visual exam of the cervix, and then an exam to evaluate the size and shape of the uterus and  ovaries.  And after 48 years of age, a rectal exam is indicated to check for rectal cancers. We will also provide age appropriate advice regarding health maintenance, like when you should have certain vaccines, screening for sexually transmitted diseases, bone density testing, colonoscopy, mammograms, etc.   MAMMOGRAMS:  All women over 40 years old should have a yearly mammogram. Many facilities now offer a "3D" mammogram, which may cost around $50 extra out of pocket. If possible,  we recommend you accept the option to have the 3D mammogram performed.  It both reduces the number of women who will be called back for extra views which then turn out to be normal, and it is better than the routine mammogram at detecting truly abnormal areas.    COLON CANCER SCREENING: Now recommend starting at age 45. At this time colonoscopy is not covered for routine screening until 50. There are take home tests that can be done between 45-49.   COLONOSCOPY:  Colonoscopy to screen for colon cancer is recommended for all women at age 50.  We know, you hate the idea of the prep.  We agree, BUT, having colon cancer and not knowing it is worse!!  Colon cancer so often starts as a polyp that can be seen and removed at colonscopy, which can quite literally save your life!  And if your first colonoscopy is normal and you have no family history of colon cancer, most women don't have to have it again for   10 years.  Once every ten years, you can do something that may end up saving your life, right?  We will be happy to help you get it scheduled when you are ready.  Be sure to check your insurance coverage so you understand how much it will cost.  It may be covered as a preventative service at no cost, but you should check your particular policy.      Breast Self-Awareness Breast self-awareness means being familiar with how your breasts look and feel. It involves checking your breasts regularly and reporting any changes to your  health care provider. Practicing breast self-awareness is important. A change in your breasts can be a sign of a serious medical problem. Being familiar with how your breasts look and feel allows you to find any problems early, when treatment is more likely to be successful. All women should practice breast self-awareness, including women who have had breast implants. How to do a breast self-exam One way to learn what is normal for your breasts and whether your breasts are changing is to do a breast self-exam. To do a breast self-exam: Look for Changes  1. Remove all the clothing above your waist. 2. Stand in front of a mirror in a room with good lighting. 3. Put your hands on your hips. 4. Push your hands firmly downward. 5. Compare your breasts in the mirror. Look for differences between them (asymmetry), such as: ? Differences in shape. ? Differences in size. ? Puckers, dips, and bumps in one breast and not the other. 6. Look at each breast for changes in your skin, such as: ? Redness. ? Scaly areas. 7. Look for changes in your nipples, such as: ? Discharge. ? Bleeding. ? Dimpling. ? Redness. ? A change in position. Feel for Changes Carefully feel your breasts for lumps and changes. It is best to do this while lying on your back on the floor and again while sitting or standing in the shower or tub with soapy water on your skin. Feel each breast in the following way:  Place the arm on the side of the breast you are examining above your head.  Feel your breast with the other hand.  Start in the nipple area and make  inch (2 cm) overlapping circles to feel your breast. Use the pads of your three middle fingers to do this. Apply light pressure, then medium pressure, then firm pressure. The light pressure will allow you to feel the tissue closest to the skin. The medium pressure will allow you to feel the tissue that is a little deeper. The firm pressure will allow you to feel the tissue  close to the ribs.  Continue the overlapping circles, moving downward over the breast until you feel your ribs below your breast.  Move one finger-width toward the center of the body. Continue to use the  inch (2 cm) overlapping circles to feel your breast as you move slowly up toward your collarbone.  Continue the up and down exam using all three pressures until you reach your armpit.  Write Down What You Find  Write down what is normal for each breast and any changes that you find. Keep a written record with breast changes or normal findings for each breast. By writing this information down, you do not need to depend only on memory for size, tenderness, or location. Write down where you are in your menstrual cycle, if you are still menstruating. If you are having trouble noticing differences   in your breasts, do not get discouraged. With time you will become more familiar with the variations in your breasts and more comfortable with the exam. How often should I examine my breasts? Examine your breasts every month. If you are breastfeeding, the best time to examine your breasts is after a feeding or after using a breast pump. If you menstruate, the best time to examine your breasts is 5-7 days after your period is over. During your period, your breasts are lumpier, and it may be more difficult to notice changes. When should I see my health care provider? See your health care provider if you notice:  A change in shape or size of your breasts or nipples.  A change in the skin of your breast or nipples, such as a reddened or scaly area.  Unusual discharge from your nipples.  A lump or thick area that was not there before.  Pain in your breasts.  Anything that concerns you.  

## 2019-12-01 NOTE — Progress Notes (Signed)
49 y.o. G19P0020 Married Black or [place] American Not Hispanic or Latino female here for annual exam.  No vaginal bleeding. No bowel/bladder c/o. No dyspareunia.     She is s/p TLH/RS/LO/extensive LOA in 2/21. She was admitted with a SBO 2 weeks later. Doing well   Patient's last menstrual period was 03/20/2019 (approximate).          Sexually active: Yes.    The current method of family planning is status post hysterectomy.    Exercising: No.  The patient has a physically strenuous job, but has no regular exercise apart from work.  Smoker:  no  Health Maintenance: Pap: 10/16/16 Negative, 09/28/15: negative, negative HPV  History of abnormal Pap:  no MMG:  03/16/18 Bi- rads 1 neg  BMD:   Never  Colonoscopy: 08/26/19 normal F/u 10 years  TDaP:  10/21/11 Gardasil: none    reports that she has never smoked. She has never used smokeless tobacco. She reports that she does not drink alcohol and does not use drugs. She has a Scientist, water quality.   Past Medical History:  Diagnosis Date  . Abnormal Pap smear of cervix   . Anemia   . Anxiety   . Asthma    exercise induced  . Endometriosis   . Headache    migraines-occassionally when does not have glasses on  . Infertility, female   . Small bowel obstruction [facility])     Past Surgical History:  Procedure Laterality Date  . CYSTOSCOPY N/A 03/28/2019   Procedure: CYSTOSCOPY;  Surgeon: Salvadore Dom, MD;  Location: [facility];  Service: Gynecology;  Laterality: N/A;  . SALPINGECTOMY     left due to ectopic, laparotomy. Endometriosis noted.   . SMALL INTESTINE SURGERY    . TOTAL LAPAROSCOPIC HYSTERECTOMY WITH SALPINGECTOMY Bilateral 03/28/2019   Procedure: TOTAL LAPAROSCOPIC HYSTERECTOMY WITH RIGHT SALPINGECTOMY/LEFT OVARIAN CYSTECTOMY WITH LEFT OOPHERECTOMY;  Surgeon: Salvadore Dom, MD;  Location: [facility];  Service: Gynecology;  Laterality: Bilateral;  LEFT OVARIAN CYSTECTOMY WITH POSSIBLE LEFT  OOPHERECTOMY/EXTENDED RECOVERY BED/3 hour surgery time/ks    Current Outpatient Medications  Medication Sig Dispense Refill  . acetaminophen (TYLENOL) 500 MG tablet Take 2 tablets (1,000 mg total) by mouth every 6 (six) hours. 30 tablet 0  . albuterol (VENTOLIN HFA) 108 (90 Base) MCG/ACT inhaler Inhale 2 puffs into the lungs every 6 (six) hours as needed for wheezing or shortness of breath.    . cholecalciferol (VITAMIN D3) 25 MCG (1000 UNIT) tablet Take 1,000 Units by mouth daily.    . fluticasone (FLONASE) 50 MCG/ACT nasal spray Place 1 spray into both nostrils daily.    . hydrOXYzine (ATARAX/VISTARIL) 25 MG tablet Take 25 mg by mouth 3 (three) times daily as needed.    . triamcinolone cream (KENALOG) 0.1 % Apply 1 application topically daily as needed (itching).     No current facility-administered medications for this visit.    Family History  Problem Relation Age of Onset  . Breast cancer Maternal Aunt        does not know age  . Colon cancer Maternal Aunt        dx in late 41s  . Stomach cancer Maternal Grandmother        around 68 at dx  . Breast cancer Paternal Aunt   . Esophageal cancer Neg Hx   . Pancreatic cancer Neg Hx   . Liver cancer Neg Hx   Mom one of 5 or 6 girls No  grandparents or cousins with breast or colon cancer.   Review of Systems  All other systems reviewed and are negative.   Exam:   BP 133/72   Pulse 87   Ht 5\' 4"  (1.626 m)   Wt 143 lb (64.9 kg)   LMP 03/20/2019 (Approximate)   SpO2 100%   BMI 24.55 kg/m   Weight change: @WEIGHTCHANGE @ Height:   Height: 5\' 4"  (162.6 cm)  Ht Readings from Last 3 Encounters:  12/01/19 5\' 4"  (1.626 m)  08/26/19 5' 4.25" (1.632 m)  07/21/19 5' 4.25" (1.632 m)    General appearance: alert, cooperative and appears stated age Head: Normocephalic, without obvious abnormality, atraumatic Neck: no adenopathy, supple, symmetrical, trachea midline and thyroid normal to inspection and palpation Lungs: clear to  auscultation bilaterally Cardiovascular: regular rate and rhythm Breasts: normal appearance, no masses or tenderness Abdomen: soft, non-tender; non distended,  no masses,  no organomegaly Extremities: extremities normal, atraumatic, no cyanosis or edema Skin: Skin color, texture, turgor normal. No rashes or lesions Lymph nodes: Cervical, supraclavicular, and axillary nodes normal. No abnormal inguinal nodes palpated Neurologic: Grossly normal   Pelvic: External genitalia:  no lesions              Urethra:  normal appearing urethra with no masses, tenderness or lesions              Bartholins and Skenes: normal                 Vagina: normal appearing vagina with normal color and discharge, no lesions              Cervix: absent               Bimanual Exam:  Uterus:  uterus absent              Adnexa: no mass, fullness, tenderness               Rectovaginal: Confirms               Anus:  normal sphincter tone, no lesions  Terence Lux chaperoned for the exam.  A:  Well Woman with normal exam  FH of breast cancer and colon cancer. Discussed the option of seeing a Dietitian, declines for now   P:   No pap needed  Mammogram overdue, she will schedule  Colonoscopy UTD  Discussed breast self exam  Discussed calcium and vit D intake  Labs UTD

## 2019-12-23 DIAGNOSIS — M25531 Pain in right wrist: Secondary | ICD-10-CM | POA: Diagnosis not present

## 2020-03-15 ENCOUNTER — Other Ambulatory Visit: Payer: Self-pay | Admitting: Obstetrics and Gynecology

## 2020-03-15 DIAGNOSIS — Z1231 Encounter for screening mammogram for malignant neoplasm of breast: Secondary | ICD-10-CM

## 2020-03-20 DIAGNOSIS — H524 Presbyopia: Secondary | ICD-10-CM | POA: Diagnosis not present

## 2020-04-02 ENCOUNTER — Encounter: Payer: Self-pay | Admitting: Emergency Medicine

## 2020-04-02 ENCOUNTER — Other Ambulatory Visit: Payer: Self-pay

## 2020-04-02 ENCOUNTER — Ambulatory Visit
Admission: EM | Admit: 2020-04-02 | Discharge: 2020-04-02 | Disposition: A | Discharge status: Home / Self Care | Payer: BC Managed Care – PPO | Attending: Physician Assistant | Admitting: Physician Assistant

## 2020-04-02 DIAGNOSIS — M545 Low back pain, unspecified: Secondary | ICD-10-CM

## 2020-04-02 MED ORDER — PREDNISONE 50 MG PO TABS: ORAL_TABLET | ORAL | 0 refills | Status: DC

## 2020-04-02 NOTE — ED Provider Notes (Signed)
[facility]    CSN: [id number] Arrival date & time: 04/02/20  0907      History   Chief Complaint Chief Complaint  Patient presents with  . Back Pain    HPI Angela Glover is a 50 y.o. female.   The history is provided by the patient. No language interpreter was used.  Back Pain Location:  Lumbar spine Quality:  Aching Pain severity:  Moderate Pain is:  Same all the time Onset quality:  Sudden Timing:  Constant Progression:  Worsening Chronicity:  New Context: recent injury   Relieved by:  Nothing Pt reports she was lifting a case of water at work and back became painful   Past Medical History:  Diagnosis Date  . Abnormal Pap smear of cervix   . Anemia   . Anxiety   . Asthma    exercise induced  . Endometriosis   . Headache    migraines-occassionally when does not have glasses on  . Infertility, female   . Small bowel obstruction [facility])     Patient Active Problem List   Diagnosis Date Noted  . SBO (small bowel obstruction) ([place]) 04/09/2019  . Status post laparoscopic hysterectomy 03/28/2019  . Asthma 03/25/2017  . Knee pain 10/16/2016  . Low back pain 10/16/2016    Past Surgical History:  Procedure Laterality Date  . CYSTOSCOPY N/A 03/28/2019   Procedure: CYSTOSCOPY;  Surgeon: Salvadore Dom, MD;  Location: [facility];  Service: Gynecology;  Laterality: N/A;  . SALPINGECTOMY     left due to ectopic, laparotomy. Endometriosis noted.   . SMALL INTESTINE SURGERY    . TOTAL LAPAROSCOPIC HYSTERECTOMY WITH SALPINGECTOMY Bilateral 03/28/2019   Procedure: TOTAL LAPAROSCOPIC HYSTERECTOMY WITH RIGHT SALPINGECTOMY/LEFT OVARIAN CYSTECTOMY WITH LEFT OOPHERECTOMY;  Surgeon: Salvadore Dom, MD;  Location: [facility];  Service: Gynecology;  Laterality: Bilateral;  LEFT OVARIAN CYSTECTOMY WITH POSSIBLE LEFT OOPHERECTOMY/EXTENDED RECOVERY BED/3 hour surgery time/ks    OB History    Gravida  2   Para  0    Term  0   Preterm  0   AB  2   Living  0     SAB  1   IAB      Ectopic  1   Multiple      Live Births               Home Medications    Prior to Admission medications   Medication Sig Start Date End Date Taking? Authorizing Provider  predniSONE (DELTASONE) 50 MG tablet One tablet a day for 5 days 04/02/20  Yes Caryl Ada K, PA-C  acetaminophen (TYLENOL) 500 MG tablet Take 2 tablets (1,000 mg total) by mouth every 6 (six) hours. 03/29/19   Salvadore Dom, MD  albuterol (VENTOLIN HFA) 108 (90 Base) MCG/ACT inhaler Inhale 2 puffs into the lungs every 6 (six) hours as needed for wheezing or shortness of breath.    [provider]  cholecalciferol (VITAMIN D3) 25 MCG (1000 UNIT) tablet Take 1,000 Units by mouth daily.    [provider]  fluticasone (FLONASE) 50 MCG/ACT nasal spray Place 1 spray into both nostrils daily.    [provider]  hydrOXYzine (ATARAX/VISTARIL) 25 MG tablet Take 25 mg by mouth 3 (three) times daily as needed.    [provider]  triamcinolone cream (KENALOG) 0.1 % Apply 1 application topically daily as needed (itching).    [provider]    [facility]  History Family History  Problem Relation Age of Onset  . Breast cancer Maternal Aunt        does not know age  . Colon cancer Maternal Aunt        dx in late 70s  . Stomach cancer Maternal Grandmother        around 60 at dx  . Breast cancer Paternal Aunt   . Esophageal cancer Neg Hx   . Pancreatic cancer Neg Hx   . Liver cancer Neg Hx     Social History Social History   Tobacco Use  . Smoking status: Never Smoker  . Smokeless tobacco: Never Used  Vaping Use  . Vaping Use: Never used  Substance Use Topics  . Alcohol use: No  . Drug use: No     Allergies   Oxycodone and Latex   Review of Systems Review of Systems  Musculoskeletal: Positive for back pain.  All other systems reviewed and are negative.    Physical Exam Triage  Vital Signs ED Triage Vitals  Enc Vitals Group     BP 04/02/20 0919 124/81     Pulse Rate 04/02/20 0919 92     Resp 04/02/20 0919 17     Temp 04/02/20 0919 98.2 F (36.8 C)     Temp Source 04/02/20 0919 Oral     SpO2 04/02/20 0919 99 %     Weight --      Height --      Head Circumference --      Peak Flow --      Pain Score 04/02/20 0918 7     Pain Loc --      Pain Edu? --      Excl. in [place]? --    No data found.  Updated Vital Signs BP 124/81 (BP Location: Right Arm)   Pulse 92   Temp 98.2 F (36.8 C) (Oral)   Resp 17   LMP 03/20/2019 (Approximate)   SpO2 99%   Visual Acuity Right Eye Distance:   Left Eye Distance:   Bilateral Distance:    Right Eye Near:   Left Eye Near:    Bilateral Near:     Physical Exam Vitals and nursing note reviewed.  Constitutional:      Appearance: She is well-developed and well-nourished.  HENT:     Head: Normocephalic.  Eyes:     Extraocular Movements: EOM normal.  Cardiovascular:     Rate and Rhythm: Normal rate.  Pulmonary:     Effort: Pulmonary effort is normal.  Abdominal:     General: There is no distension.  Musculoskeletal:        General: Normal range of motion.     Cervical back: Normal range of motion.     Comments: Tender lower right paravertebral area normal dtr's   Skin:    General: Skin is warm.  Neurological:     General: No focal deficit present.     Mental Status: She is alert and oriented to person, place, and time.  Psychiatric:        Mood and Affect: Mood and affect and mood normal.      UC Treatments / Results  Labs (all labs ordered are listed, but only abnormal results are displayed) Labs Reviewed - No data to display  EKG   Radiology No results found.  Procedures Procedures (including critical care time)  Medications Ordered in UC Medications - No data to display  Initial Impression / Assessment and Plan /  UC Course  I have reviewed the triage vital signs and the nursing  notes.  Pertinent labs & imaging results that were available during my care of the patient were reviewed by me and considered in my medical decision making (see chart for details).     MDM:  Pt given rx for prednsione.  Pt advised rest.  Final Clinical Impressions(s) / UC Diagnoses   Final diagnoses:  Low back pain, unspecified back pain laterality, unspecified chronicity, unspecified whether sciatica present   Discharge Instructions   None    ED Prescriptions    Medication Sig Dispense Auth. Provider   predniSONE (DELTASONE) 50 MG tablet One tablet a day for 5 days 5 tablet Fransico Meadow, [place]     PDMP not reviewed this encounter.  An After Visit Summary was printed and given to the patient.   Fransico Meadow, [place] 04/02/20 [phone]

## 2020-04-02 NOTE — ED Triage Notes (Signed)
Pt states that she was kneeling down to put some water up and she felt a pain shoot through her back and tingling in her left leg and toes. Pt states that she took tumeric and she states that it did help with some relief but the pain has return this morning.

## 2020-04-05 DIAGNOSIS — M5459 Other low back pain: Secondary | ICD-10-CM | POA: Diagnosis not present

## 2020-04-05 DIAGNOSIS — Z1231 Encounter for screening mammogram for malignant neoplasm of breast: Secondary | ICD-10-CM

## 2020-04-05 DIAGNOSIS — M62830 Muscle spasm of back: Secondary | ICD-10-CM | POA: Diagnosis not present

## 2020-04-06 DIAGNOSIS — M545 Low back pain, unspecified: Secondary | ICD-10-CM | POA: Diagnosis not present

## 2020-04-13 DIAGNOSIS — M545 Low back pain, unspecified: Secondary | ICD-10-CM | POA: Diagnosis not present

## 2020-05-04 DIAGNOSIS — M5136 Other intervertebral disc degeneration, lumbar region: Secondary | ICD-10-CM | POA: Diagnosis not present

## 2020-05-14 ENCOUNTER — Ambulatory Visit
Admission: RE | Admit: 2020-05-14 | Discharge: 2020-05-14 | Disposition: A | Discharge status: Home / Self Care | Payer: BC Managed Care – PPO | Source: Ambulatory Visit | Attending: Obstetrics and Gynecology | Admitting: Obstetrics and Gynecology

## 2020-05-14 ENCOUNTER — Other Ambulatory Visit: Payer: Self-pay

## 2020-05-14 DIAGNOSIS — Z1231 Encounter for screening mammogram for malignant neoplasm of breast: Secondary | ICD-10-CM

## 2020-08-01 DIAGNOSIS — N959 Unspecified menopausal and perimenopausal disorder: Secondary | ICD-10-CM | POA: Diagnosis not present

## 2020-08-01 DIAGNOSIS — J45909 Unspecified asthma, uncomplicated: Secondary | ICD-10-CM | POA: Diagnosis not present

## 2020-08-01 DIAGNOSIS — H1013 Acute atopic conjunctivitis, bilateral: Secondary | ICD-10-CM | POA: Diagnosis not present

## 2020-12-04 ENCOUNTER — Ambulatory Visit: Payer: BC Managed Care – PPO | Admitting: Obstetrics and Gynecology

## 2021-01-22 NOTE — Progress Notes (Signed)
50 y.o. G28P0020 Married Black or [place] American Not Hispanic or Latino female here for annual exam.   No c/o. No dyspareunia. No bowel or bladder c/o.    She is s/p TLH/RS/LO/extensive LOA in 2/21. She was admitted with a SBO 2 weeks later.  She is gaining weight.  Patient's last menstrual period was 03/20/2019 (approximate).          Sexually active: Yes.    The current method of family planning is status post hysterectomy.    Exercising: Yes.     Cardio/walking/elliptical  Smoker:  no  Health Maintenance: Pap:  10-16-16 normal History of abnormal Pap:  no MMG:  05-14-20 normal BMD:   never Colonoscopy: 2021 normal TDaP:  2013 Gardasil: none   reports that she has never smoked. She has never used smokeless tobacco. She reports that she does not drink alcohol and does not use drugs. She has a Scientist, water quality.   Past Medical History:  Diagnosis Date   Abnormal Pap smear of cervix    Anemia    Anxiety    Asthma    exercise induced   Endometriosis    Headache    migraines-occassionally when does not have glasses on   Infertility, female    Small bowel obstruction [facility])     Past Surgical History:  Procedure Laterality Date   CYSTOSCOPY N/A 03/28/2019   Procedure: CYSTOSCOPY;  Surgeon: Salvadore Dom, MD;  Location: [facility];  Service: Gynecology;  Laterality: N/A;   SALPINGECTOMY     left due to ectopic, laparotomy. Endometriosis noted.    SMALL INTESTINE SURGERY     TOTAL LAPAROSCOPIC HYSTERECTOMY WITH SALPINGECTOMY Bilateral 03/28/2019   Procedure: TOTAL LAPAROSCOPIC HYSTERECTOMY WITH RIGHT SALPINGECTOMY/LEFT OVARIAN CYSTECTOMY WITH LEFT OOPHERECTOMY;  Surgeon: Salvadore Dom, MD;  Location: [place];  Service: Gynecology;  Laterality: Bilateral;  LEFT OVARIAN CYSTECTOMY WITH POSSIBLE LEFT OOPHERECTOMY/EXTENDED RECOVERY BED/3 hour surgery time/ks    Current Outpatient Medications  Medication Sig Dispense Refill    albuterol (VENTOLIN HFA) 108 (90 Base) MCG/ACT inhaler Inhale 2 puffs into the lungs every 6 (six) hours as needed for wheezing or shortness of breath.     fluticasone (FLONASE) 50 MCG/ACT nasal spray Place 1 spray into both nostrils daily.     ibuprofen (ADVIL) 800 MG tablet Take 800 mg by mouth 3 (three) times daily.     No current facility-administered medications for this visit.    Family History  Problem Relation Age of Onset   Breast cancer Maternal Aunt        does not know age   Colon cancer Maternal Aunt        dx in late 19s   Stomach cancer Maternal Grandmother        around 25 at dx   Breast cancer Paternal Aunt    Esophageal cancer Neg Hx    Pancreatic cancer Neg Hx    Liver cancer Neg Hx     Review of Systems  Exam:   BP 104/74   Pulse 63   Ht 5' 3.5" (1.613 m)   Wt 158 lb (71.7 kg)   LMP 03/20/2019 (Approximate)   SpO2 95%   BMI 27.55 kg/m   Weight change: @WEIGHTCHANGE @ Height:   Height: 5' 3.5" (161.3 cm)  Ht Readings from Last 3 Encounters:  02/01/21 5' 3.5" (1.613 m)  12/01/19 5\' 4"  (1.626 m)  08/26/19 5' 4.25" (1.632 m)    General appearance: alert, cooperative and  appears stated age Head: Normocephalic, without obvious abnormality, atraumatic Neck: no adenopathy, supple, symmetrical, trachea midline and thyroid normal to inspection and palpation Lungs: clear to auscultation bilaterally Cardiovascular: regular rate and rhythm Breasts: normal appearance, no masses or tenderness Abdomen: soft, non-tender; non distended,  no masses,  no organomegaly Extremities: extremities normal, atraumatic, no cyanosis or edema Skin: Skin color, texture, turgor normal. No rashes or lesions Lymph nodes: Cervical, supraclavicular, and axillary nodes normal. No abnormal inguinal nodes palpated Neurologic: Grossly normal   Pelvic: External genitalia:  no lesions              Urethra:  normal appearing urethra with no masses, tenderness or lesions               Bartholins and Skenes: normal                 Vagina: normal appearing vagina with normal color and discharge, no lesions              Cervix: absent               Bimanual Exam:  Uterus:  uterus absent              Adnexa: no mass, fullness, tenderness               Rectovaginal: Confirms               Anus:  normal sphincter tone, no lesions  Lovena Le, CMA chaperoned for the exam.  1. Well woman exam No pap needed Mammogram in 3/23 Colonoscopy UTD No pap needed Discussed breast self exam Discussed calcium and vit D intake   2. Status post laparoscopic hysterectomy -Doing well  3. Family history of breast cancer Have discussed genetic counseling, declines  4. Family history of colon cancer Have discussed genetic counseling, declines  5. Laboratory exam ordered as part of routine general medical examination - CBC - Comprehensive metabolic panel - Lipid panel  6. Vitamin D deficiency No longer taking vit d - VITAMIN D 25 Hydroxy (Vit-D Deficiency, Fractures)  7. Elevated cholesterol - Lipid panel  8. Weight gain - Lipid panel - TSH -Discussed exercise and eating healthy

## 2021-01-25 ENCOUNTER — Ambulatory Visit: Payer: BC Managed Care – PPO | Admitting: Obstetrics and Gynecology

## 2021-02-01 ENCOUNTER — Other Ambulatory Visit: Payer: Self-pay

## 2021-02-01 ENCOUNTER — Ambulatory Visit (INDEPENDENT_AMBULATORY_CARE_PROVIDER_SITE_OTHER): Payer: BC Managed Care – PPO | Admitting: Obstetrics and Gynecology

## 2021-02-01 ENCOUNTER — Encounter: Payer: Self-pay | Admitting: Obstetrics and Gynecology

## 2021-02-01 VITALS — BP 104/74 | HR 63 | Ht 63.5 in | Wt 158.0 lb

## 2021-02-01 DIAGNOSIS — Z90710 Acquired absence of both cervix and uterus: Secondary | ICD-10-CM | POA: Diagnosis not present

## 2021-02-01 DIAGNOSIS — Z01419 Encounter for gynecological examination (general) (routine) without abnormal findings: Secondary | ICD-10-CM | POA: Diagnosis not present

## 2021-02-01 DIAGNOSIS — Z803 Family history of malignant neoplasm of breast: Secondary | ICD-10-CM | POA: Diagnosis not present

## 2021-02-01 DIAGNOSIS — Z800 Family history of malignant neoplasm of digestive organs: Secondary | ICD-10-CM

## 2021-02-01 DIAGNOSIS — E559 Vitamin D deficiency, unspecified: Secondary | ICD-10-CM

## 2021-02-01 DIAGNOSIS — Z0000 Encounter for general adult medical examination without abnormal findings: Secondary | ICD-10-CM

## 2021-02-01 DIAGNOSIS — E7800 Pure hypercholesterolemia, unspecified: Secondary | ICD-10-CM

## 2021-02-01 DIAGNOSIS — R635 Abnormal weight gain: Secondary | ICD-10-CM

## 2021-02-01 NOTE — Patient Instructions (Signed)

## 2021-02-02 LAB — COMPREHENSIVE METABOLIC PANEL
AG Ratio: 1.6 (calc) (ref 1.0–2.5)
ALT: 14 U/L (ref 6–29)
AST: 21 U/L (ref 10–35)
Albumin: 4.3 g/dL (ref 3.6–5.1)
Alkaline phosphatase (APISO): 65 U/L (ref 31–125)
BUN: 8 mg/dL (ref 7–25)
CO2: 24 mmol/L (ref 20–32)
Calcium: 9.3 mg/dL (ref 8.6–10.2)
Chloride: 106 mmol/L (ref 98–110)
Creat: 0.74 mg/dL (ref 0.50–0.99)
Globulin: 2.7 g/dL (calc) (ref 1.9–3.7)
Glucose, Bld: 83 mg/dL (ref 65–99)
Potassium: 4 mmol/L (ref 3.5–5.3)
Sodium: 139 mmol/L (ref 135–146)
Total Bilirubin: 0.7 mg/dL (ref 0.2–1.2)
Total Protein: 7 g/dL (ref 6.1–8.1)

## 2021-02-02 LAB — LIPID PANEL
Cholesterol: 198 mg/dL (ref <200)
HDL: 56 mg/dL (ref >=50)
LDL Cholesterol (Calc): 126 mg/dL (calc) — ABNORMAL HIGH
Non-HDL Cholesterol (Calc): 142 mg/dL (calc) — ABNORMAL HIGH (ref <130)
Total CHOL/HDL Ratio: 3.5 (calc) (ref <5.0)
Triglycerides: 65 mg/dL (ref <150)

## 2021-02-02 LAB — TSH: TSH: 0.98 mIU/L

## 2021-02-02 LAB — CBC
HCT: 40.6 % (ref 35.0–45.0)
Hemoglobin: 13 g/dL (ref 11.7–15.5)
MCH: 26.5 pg — ABNORMAL LOW (ref 27.0–33.0)
MCHC: 32 g/dL (ref 32.0–36.0)
MCV: 82.9 fL (ref 80.0–100.0)
MPV: 10.3 fL (ref 7.5–12.5)
Platelets: 386 10*3/uL (ref 140–400)
RBC: 4.9 10*6/uL (ref 3.80–5.10)
RDW: 11.6 % (ref 11.0–15.0)
WBC: 4.3 10*3/uL (ref 3.8–10.8)

## 2021-02-02 LAB — VITAMIN D 25 HYDROXY (VIT D DEFICIENCY, FRACTURES): Vit D, 25-Hydroxy: 21 ng/mL — ABNORMAL LOW (ref 30–100)

## 2021-04-01 ENCOUNTER — Other Ambulatory Visit: Payer: Self-pay | Admitting: Obstetrics and Gynecology

## 2021-04-01 DIAGNOSIS — Z1231 Encounter for screening mammogram for malignant neoplasm of breast: Secondary | ICD-10-CM

## 2021-05-21 ENCOUNTER — Ambulatory Visit
Admission: RE | Admit: 2021-05-21 | Discharge: 2021-05-21 | Disposition: A | Discharge status: Home / Self Care | Payer: BC Managed Care – PPO | Source: Ambulatory Visit | Attending: Obstetrics and Gynecology | Admitting: Obstetrics and Gynecology

## 2021-05-21 DIAGNOSIS — Z1231 Encounter for screening mammogram for malignant neoplasm of breast: Secondary | ICD-10-CM

## 2022-01-30 NOTE — Progress Notes (Signed)
51 y.o. G73P0020 Married Black or [place] American Not Hispanic or Latino female here for annual exam.  She is s/p TLH/RS/LO/extensive LOA in 2/21. She was admitted with a SBO 2 weeks later.   She is having hot flashes that are keeping her up at night. Her primary started her on Paxil for vasomotor symptoms, but it is not working. She is having ~5 hot flashes during the day, at night she having >5 hot flashes/night sweats a night. She has started taking advil PM to try to sleep.    She has a FH of breast and colon cancer, has declined genetic counseling.   Patient's last menstrual period was 03/20/2019 (approximate).          Sexually active: Yes.    The current method of family planning is status post hysterectomy.    Exercising: Yes.     Walking  Smoker:  no  Health Maintenance: Pap:   10-16-16 normal History of abnormal Pap:  no MMG:  05/21/21 density C Bi-rads 1 neg BMD:   n/a Colonoscopy: 04/2018, F/U in 10 years.  TDaP:  10/21/11 Gardasil: n/a   reports that she has never smoked. She has never used smokeless tobacco. She reports that she does not drink alcohol and does not use drugs.  She has a Scientist, water quality, she working 4 jobs, tired all the time.   Past Medical History:  Diagnosis Date   Abnormal Pap smear of cervix    Anemia    Anxiety    Asthma    exercise induced   Endometriosis    Headache    migraines-occassionally when does not have glasses on   Infertility, female    Small bowel obstruction [facility])     Past Surgical History:  Procedure Laterality Date   CYSTOSCOPY N/A 03/28/2019   Procedure: CYSTOSCOPY;  Surgeon: Salvadore Dom, MD;  Location: [facility];  Service: Gynecology;  Laterality: N/A;   SALPINGECTOMY     left due to ectopic, laparotomy. Endometriosis noted.    SMALL INTESTINE SURGERY     TOTAL LAPAROSCOPIC HYSTERECTOMY WITH SALPINGECTOMY Bilateral 03/28/2019   Procedure: TOTAL LAPAROSCOPIC HYSTERECTOMY WITH RIGHT SALPINGECTOMY/LEFT  OVARIAN CYSTECTOMY WITH LEFT OOPHERECTOMY;  Surgeon: Salvadore Dom, MD;  Location: [place];  Service: Gynecology;  Laterality: Bilateral;  LEFT OVARIAN CYSTECTOMY WITH POSSIBLE LEFT OOPHERECTOMY/EXTENDED RECOVERY BED/3 hour surgery time/ks    Current Outpatient Medications  Medication Sig Dispense Refill   albuterol (VENTOLIN HFA) 108 (90 Base) MCG/ACT inhaler Inhale 2 puffs into the lungs every 6 (six) hours as needed for wheezing or shortness of breath.     fluticasone (FLONASE) 50 MCG/ACT nasal spray Place 1 spray into both nostrils daily.     ibuprofen (ADVIL) 800 MG tablet Take 800 mg by mouth 3 (three) times daily.     PARoxetine (PAXIL) 20 MG tablet TAKE 1 TABLET BY MOUTH ONCE DAILY FOR 90 DAYS     No current facility-administered medications for this visit.    Family History  Problem Relation Age of Onset   Breast cancer Maternal Aunt        does not know age   Colon cancer Maternal Aunt        dx in late 44s   Stomach cancer Maternal Grandmother        around 57 at dx   Breast cancer Paternal Aunt    Esophageal cancer Neg Hx    Pancreatic cancer Neg Hx    Liver cancer  Neg Hx   Both aunts were in their 72's when they were diagnosis.   Review of Systems  All other systems reviewed and are negative.   Exam:   BP 110/70   Pulse 66   Ht 5' 4.25" (1.632 m)   Wt 157 lb (71.2 kg)   LMP 03/20/2019 (Approximate)   SpO2 100%   BMI 26.74 kg/m   Weight change: '@WEIGHTCHANGE'$ @ Height:   Height: 5' 4.25" (163.2 cm)  Ht Readings from Last 3 Encounters:  02/06/22 5' 4.25" (1.632 m)  02/01/21 5' 3.5" (1.613 m)  12/01/19 '5\' 4"'$  (1.626 m)    General appearance: alert, cooperative and appears stated age Head: Normocephalic, without obvious abnormality, atraumatic Neck: no adenopathy, supple, symmetrical, trachea midline and thyroid normal to inspection and palpation Lungs: clear to auscultation bilaterally Cardiovascular: regular rate and rhythm Breasts:  normal appearance, no masses or tenderness Abdomen: soft, non-tender; non distended,  no masses,  no organomegaly Extremities: extremities normal, atraumatic, no cyanosis or edema Skin: Skin color, texture, turgor normal. No rashes or lesions Lymph nodes: Cervical, supraclavicular, and axillary nodes normal. No abnormal inguinal nodes palpated Neurologic: Grossly normal   Pelvic: External genitalia:  no lesions              Urethra:  normal appearing urethra with no masses, tenderness or lesions              Bartholins and Skenes: normal                 Vagina: normal appearing vagina with normal color and discharge, no lesions              Cervix: absent               Bimanual Exam:  Uterus:  uterus absent              Adnexa: no mass, fullness, tenderness               Rectovaginal: Confirms               Anus:  normal sphincter tone, no lesions  Gae Dry, CMA chaperoned for the exam.  1. Well woman exam Discussed breast self exam Discussed calcium and vit D intake Mammogram in 4/24 Colonoscopy UTD No pap needed  2. Vitamin D deficiency She is taking a daily supplement, not sure of the dose - VITAMIN D 25 Hydroxy (Vit-D Deficiency, Fractures)  3. Elevated LDL cholesterol level - Lipid panel  4. Vasomotor symptoms due to menopause No help with Paxil We discussed avoiding triggers and behavioral changes.  We discussed gabapentin and ERT. No need for progesterone. She would like to try low dose ERT, no contraindications, risks reviewed, information given - Follicle stimulating hormone - estradiol (VIVELLE-DOT) 0.025 MG/24HR; Place 1 patch onto the skin 2 (two) times a week.  Dispense: 8 patch; Refill: 1  5. Immunization due - Tdap vaccine greater than or equal to 7yo IM

## 2022-02-06 ENCOUNTER — Encounter: Payer: Self-pay | Admitting: Obstetrics and Gynecology

## 2022-02-06 ENCOUNTER — Ambulatory Visit (INDEPENDENT_AMBULATORY_CARE_PROVIDER_SITE_OTHER): Payer: BC Managed Care – PPO | Admitting: Obstetrics and Gynecology

## 2022-02-06 VITALS — BP 110/70 | HR 66 | Ht 64.25 in | Wt 157.0 lb

## 2022-02-06 DIAGNOSIS — N951 Menopausal and female climacteric states: Secondary | ICD-10-CM | POA: Diagnosis not present

## 2022-02-06 DIAGNOSIS — E559 Vitamin D deficiency, unspecified: Secondary | ICD-10-CM

## 2022-02-06 DIAGNOSIS — Z23 Encounter for immunization: Secondary | ICD-10-CM

## 2022-02-06 DIAGNOSIS — E7800 Pure hypercholesterolemia, unspecified: Secondary | ICD-10-CM

## 2022-02-06 DIAGNOSIS — Z01419 Encounter for gynecological examination (general) (routine) without abnormal findings: Secondary | ICD-10-CM | POA: Diagnosis not present

## 2022-02-06 MED ORDER — ESTRADIOL 0.025 MG/24HR TD PTTW: 1.0000 | MEDICATED_PATCH | TRANSDERMAL | 1 refills | Status: DC

## 2022-02-06 NOTE — Patient Instructions (Signed)
EXERCISE   We recommended that you start or continue a regular exercise program for good health. Physical activity is anything that gets your body moving, some is better than none. The CDC recommends 150 minutes per week of Moderate-Intensity Aerobic Activity and 2 or more days of Muscle Strengthening Activity.  Benefits of exercise are limitless: helps weight loss/weight maintenance, improves mood and energy, helps with depression and anxiety, improves sleep, tones and strengthens muscles, improves balance, improves bone density, protects from chronic conditions such as heart disease, high blood pressure and diabetes and so much more. To learn more visit: https://www.cdc.gov/physicalactivity/index.html  DIET: Good nutrition starts with a healthy diet of fruits, vegetables, whole grains, and lean protein sources. Drink plenty of water for hydration. Minimize empty calories, sodium, sweets. For more information about dietary recommendations visit: https://health.gov/our-work/nutrition-physical-activity/dietary-guidelines and https://www.myplate.gov/  ALCOHOL:  Women should limit their alcohol intake to no more than 7 drinks/beers/glasses of wine (combined, not each!) per week. Moderation of alcohol intake to this level decreases your risk of breast cancer and liver damage.  If you are concerned that you may have a problem, or your friends have told you they are concerned about your drinking, there are many resources to help. A well-known program that is free, effective, and available to all people all over the nation is Alcoholics Anonymous.  Check out this site to learn more: https://www.aa.org/   CALCIUM AND VITAMIN D:  Adequate intake of calcium and Vitamin D are recommended for bone health.  You should be getting between 1000-1200 mg of calcium and 800 units of Vitamin D daily between diet and supplements  PAP SMEARS:  Pap smears, to check for cervical cancer or precancers,  have traditionally been  done yearly, scientific advances have shown that most women can have pap smears less often.  However, every woman still should have a physical exam from her gynecologist every year. It will include a breast check, inspection of the vulva and vagina to check for abnormal growths or skin changes, a visual exam of the cervix, and then an exam to evaluate the size and shape of the uterus and ovaries. We will also provide age appropriate advice regarding health maintenance, like when you should have certain vaccines, screening for sexually transmitted diseases, bone density testing, colonoscopy, mammograms, etc.   MAMMOGRAMS:  All women over 40 years old should have a routine mammogram.   COLON CANCER SCREENING: Now recommend starting at age 45. At this time colonoscopy is not covered for routine screening until 50. There are take home tests that can be done between 45-49.   COLONOSCOPY:  Colonoscopy to screen for colon cancer is recommended for all women at age 50.  We know, you hate the idea of the prep.  We agree, BUT, having colon cancer and not knowing it is worse!!  Colon cancer so often starts as a polyp that can be seen and removed at colonscopy, which can quite literally save your life!  And if your first colonoscopy is normal and you have no family history of colon cancer, most women don't have to have it again for 10 years.  Once every ten years, you can do something that may end up saving your life, right?  We will be happy to help you get it scheduled when you are ready.  Be sure to check your insurance coverage so you understand how much it will cost.  It may be covered as a preventative service at no cost, but you should check   your particular policy.      Breast Self-Awareness Breast self-awareness means being familiar with how your breasts look and feel. It involves checking your breasts regularly and reporting any changes to your health care provider. Practicing breast self-awareness is  important. A change in your breasts can be a sign of a serious medical problem. Being familiar with how your breasts look and feel allows you to find any problems early, when treatment is more likely to be successful. All women should practice breast self-awareness, including women who have had breast implants. How to do a breast self-exam One way to learn what is normal for your breasts and whether your breasts are changing is to do a breast self-exam. To do a breast self-exam: Look for Changes  Remove all the clothing above your waist. Stand in front of a mirror in a room with good lighting. Put your hands on your hips. Push your hands firmly downward. Compare your breasts in the mirror. Look for differences between them (asymmetry), such as: Differences in shape. Differences in size. Puckers, dips, and bumps in one breast and not the other. Look at each breast for changes in your skin, such as: Redness. Scaly areas. Look for changes in your nipples, such as: Discharge. Bleeding. Dimpling. Redness. A change in position. Feel for Changes Carefully feel your breasts for lumps and changes. It is best to do this while lying on your back on the floor and again while sitting or standing in the shower or tub with soapy water on your skin. Feel each breast in the following way: Place the arm on the side of the breast you are examining above your head. Feel your breast with the other hand. Start in the nipple area and make  inch (2 cm) overlapping circles to feel your breast. Use the pads of your three middle fingers to do this. Apply light pressure, then medium pressure, then firm pressure. The light pressure will allow you to feel the tissue closest to the skin. The medium pressure will allow you to feel the tissue that is a little deeper. The firm pressure will allow you to feel the tissue close to the ribs. Continue the overlapping circles, moving downward over the breast until you feel your  ribs below your breast. Move one finger-width toward the center of the body. Continue to use the  inch (2 cm) overlapping circles to feel your breast as you move slowly up toward your collarbone. Continue the up and down exam using all three pressures until you reach your armpit.  Write Down What You Find  Write down what is normal for each breast and any changes that you find. Keep a written record with breast changes or normal findings for each breast. By writing this information down, you do not need to depend only on memory for size, tenderness, or location. Write down where you are in your menstrual cycle, if you are still menstruating. If you are having trouble noticing differences in your breasts, do not get discouraged. With time you will become more familiar with the variations in your breasts and more comfortable with the exam. How often should I examine my breasts? Examine your breasts every month. If you are breastfeeding, the best time to examine your breasts is after a feeding or after using a breast pump. If you menstruate, the best time to examine your breasts is 5-7 days after your period is over. During your period, your breasts are lumpier, and it may be more   difficult to notice changes. When should I see my health care provider? See your health care provider if you notice: A change in shape or size of your breasts or nipples. A change in the skin of your breast or nipples, such as a reddened or scaly area. Unusual discharge from your nipples. A lump or thick area that was not there before. Pain in your breasts. Anything that concerns you. Menopause and Hormone Replacement Therapy Menopause is a normal time of life when menstrual periods stop completely and the ovaries stop producing the female hormones estrogen and progesterone. Low levels of these hormones can affect your health and cause symptoms. Hormone replacement therapy (HRT) can relieve some of those symptoms. HRT is  the use of artificial (synthetic) hormones to replace hormones that your body has stopped producing because you have reached menopause. Types of HRT  HRT may consist of the synthetic hormones estrogen and progestin, or it may consist of estrogen-only therapy. You and your health care provider will decide which form of HRT is best for you. If you choose to be on HRT and you have a uterus, estrogen and progestin are usually prescribed. Estrogen-only therapy is used for women who do not have a uterus. Possible options for taking HRT include: Pills. Patches. Gels. Sprays. Vaginal cream. Vaginal rings. Vaginal inserts. The amount of hormones that you take and how long you take them varies according to your health. It is important to: Begin HRT with the lowest possible dosage. Stop HRT as soon as your health care provider tells you to stop. Work with your health care provider so that you feel informed and comfortable with your decisions. Tell a health care provider about: Any allergies you have. Whether you have had blood clots or know of any risk factors you may have for blood clots. Whether you or family members have had cancer, especially cancer of the breasts, ovaries, or uterus. Any surgeries you have had. All medicines you are taking, including vitamins, herbs, eye drops, creams, and over-the-counter medicines. Whether you are pregnant or may be pregnant. Any medical conditions you have. What are the benefits? HRT can reduce the frequency and severity of menopausal symptoms. Benefits of HRT vary according to the kind of symptoms that you have, how severe they are, and your overall health. HRT may help to improve the following symptoms of menopause: Hot flashes and night sweats. These are sudden feelings of heat that spread over the face and body. The skin may turn red, like a blush. Night sweats are hot flashes that happen while you are sleeping or trying to sleep. Bone loss  (osteoporosis). The body loses calcium more quickly after menopause, causing the bones to become weaker. This can increase the risk for bone breaks (fractures). Vaginal dryness. The lining of the vagina can become thin and dry, which can cause pain during sex or cause infection, burning, or itching. Urinary tract infections. Urinary incontinence. This is the inability to control when you urinate. Irritability. Short-term memory problems. What are the risks? Risks of HRT vary depending on your individual health and medical history. Risks of HRT also depend on whether you receive both estrogen and progestin or you receive estrogen only. HRT may increase the risk of: Spotting. This is when a small amount of blood leaks from the vagina unexpectedly. Endometrial cancer. This cancer is in the lining of the uterus (endometrium). Breast cancer. Increased density of breast tissue. This can make it harder to find breast cancer on a  breast X-ray (mammogram). Stroke. Heart disease. Blood clots. Gallbladder disease or liver disease. Risks of HRT can increase if you have any of the following conditions: Endometrial cancer. Liver disease. Heart disease. Breast cancer. History of blood clots. History of stroke. Follow these instructions at home: Pap tests Have Pap tests done as often as told by your health care provider. A Pap test is sometimes called a Pap smear. It is a screening test that is used to check for signs of cancer of the cervix and vagina. A Pap test can also identify the presence of infection or precancerous changes. Pap tests may be done: Every 3 years, starting at age 28. Every 5 years, starting after age 70, in combination with testing for human papillomavirus (HPV). More often or less often depending on other medical conditions you have, your age, and other risk factors. It is up to you to get the results of your Pap test. Ask your health care provider, or the department that is doing  the test, when your results will be ready. General instructions Take over-the-counter and prescription medicines only as told by your health care provider. Do not use any products that contain nicotine or tobacco. These products include cigarettes, chewing tobacco, and vaping devices, such as e-cigarettes. If you need help quitting, ask your health care provider. Get mammograms, pelvic exams, and medical checkups as often as told by your health care provider. Keep all follow-up visits. This is important. Contact a health care provider if you have: Pain or swelling in your legs. Lumps or changes in your breasts or armpits. Pain, burning, or bleeding when you urinate. Unusual vaginal bleeding. Dizziness or headaches. Pain in your abdomen. Get help right away if you have: Shortness of breath. Chest pain. Slurred speech. Weakness or numbness in any part of your arms or legs. These symptoms may represent a serious problem that is an emergency. Do not wait to see if the symptoms will go away. Get medical help right away. Call your local emergency services (911 in the [place] Do not drive yourself to the hospital. Summary Menopause is a normal time of life when menstrual periods stop completely and the ovaries stop producing the female hormones estrogen and progesterone. HRT can reduce the frequency and severity of menopausal symptoms. Risks of HRT vary depending on your individual health and medical history. This information is not intended to replace advice given to you by your health care provider. Make sure you discuss any questions you have with your health care provider. Document Revised: 08/08/2019 Document Reviewed: 08/08/2019 Elsevier Patient Education  [place].

## 2022-02-07 LAB — VITAMIN D 25 HYDROXY (VIT D DEFICIENCY, FRACTURES): Vit D, 25-Hydroxy: 35 ng/mL (ref 30–100)

## 2022-02-07 LAB — LIPID PANEL
Cholesterol: 205 mg/dL — ABNORMAL HIGH (ref <200)
HDL: 59 mg/dL (ref >=50)
LDL Cholesterol (Calc): 121 mg/dL (calc) — ABNORMAL HIGH
Non-HDL Cholesterol (Calc): 146 mg/dL (calc) — ABNORMAL HIGH (ref <130)
Total CHOL/HDL Ratio: 3.5 (calc) (ref <5.0)
Triglycerides: 131 mg/dL (ref <150)

## 2022-02-07 LAB — FOLLICLE STIMULATING HORMONE: FSH: 36.7 m[IU]/mL

## 2022-03-11 ENCOUNTER — Telehealth: Payer: BC Managed Care – PPO | Admitting: Obstetrics and Gynecology

## 2022-03-24 ENCOUNTER — Other Ambulatory Visit: Payer: Self-pay

## 2022-03-24 ENCOUNTER — Telehealth: Payer: Self-pay

## 2022-03-24 NOTE — Telephone Encounter (Addendum)
At 02/06/22 AEX  estradiol (VIVELLE-DOT) 0.025 MG/24HR; Place 1 patch onto the skin 2 (two) times a week.  Dispense: 8 patch; Refill: 1   Patient said Pharmacy app keeps showing it unavailable.  I called [facility]. It had been on back order but is now in stock and they will get it ready for her to pick up today.  Patient advised.  I will have appt desk to call her to r/s her 2 mos follow up appt after being on the patch.

## 2022-03-27 NOTE — Telephone Encounter (Signed)
Follow up appt with Dr. Talbert Nan for 06/04/22 at 1:30pm.

## 2022-04-29 ENCOUNTER — Other Ambulatory Visit: Payer: Self-pay | Admitting: Obstetrics and Gynecology

## 2022-04-29 DIAGNOSIS — Z1231 Encounter for screening mammogram for malignant neoplasm of breast: Secondary | ICD-10-CM

## 2022-05-05 IMAGING — MG MM DIGITAL SCREENING BILAT W/ TOMO AND CAD
6 of 10 series · 6 of 30 positions shown · non-contrast
Comparison: Previous exam(s).

CLINICAL DATA: Screening.

EXAM:
DIGITAL SCREENING BILATERAL MAMMOGRAM WITH TOMOSYNTHESIS AND CAD
TECHNIQUE: Bilateral screening digital craniocaudal and mediolateral oblique
mammograms were obtained. Bilateral screening digital breast
tomosynthesis was performed. The images were evaluated with
computer-aided detection.

[R MLO synth-2D]
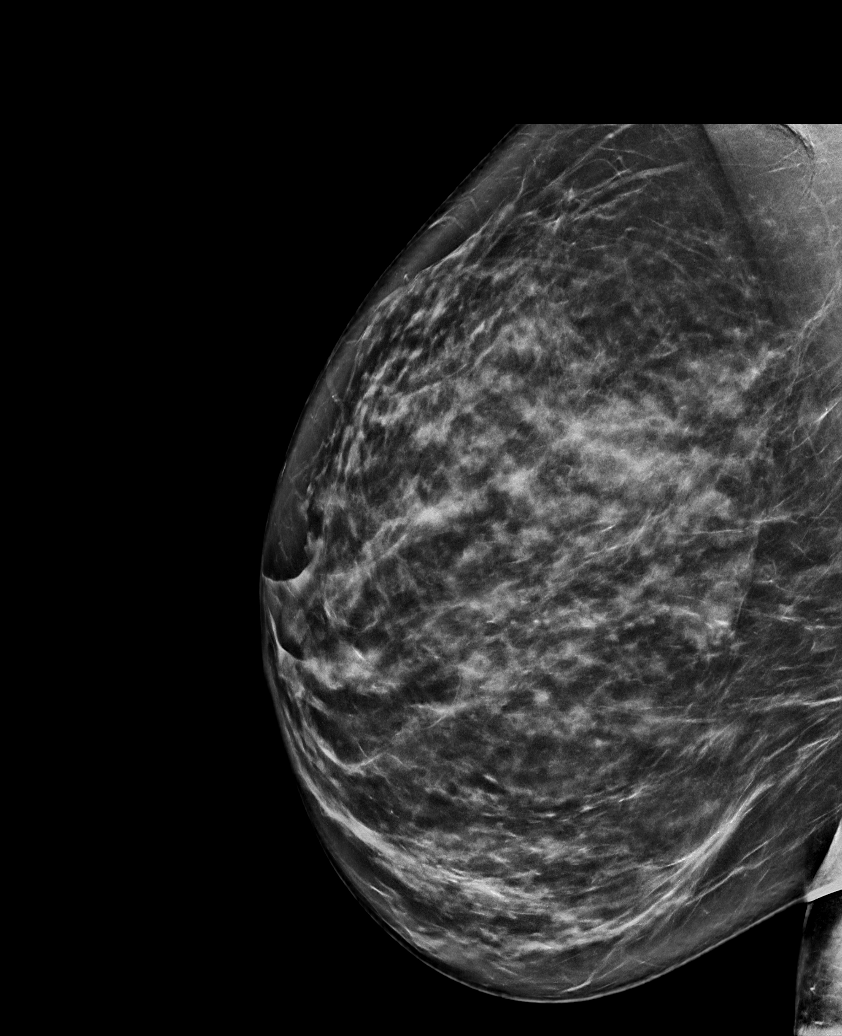

[L CC synth-2D]
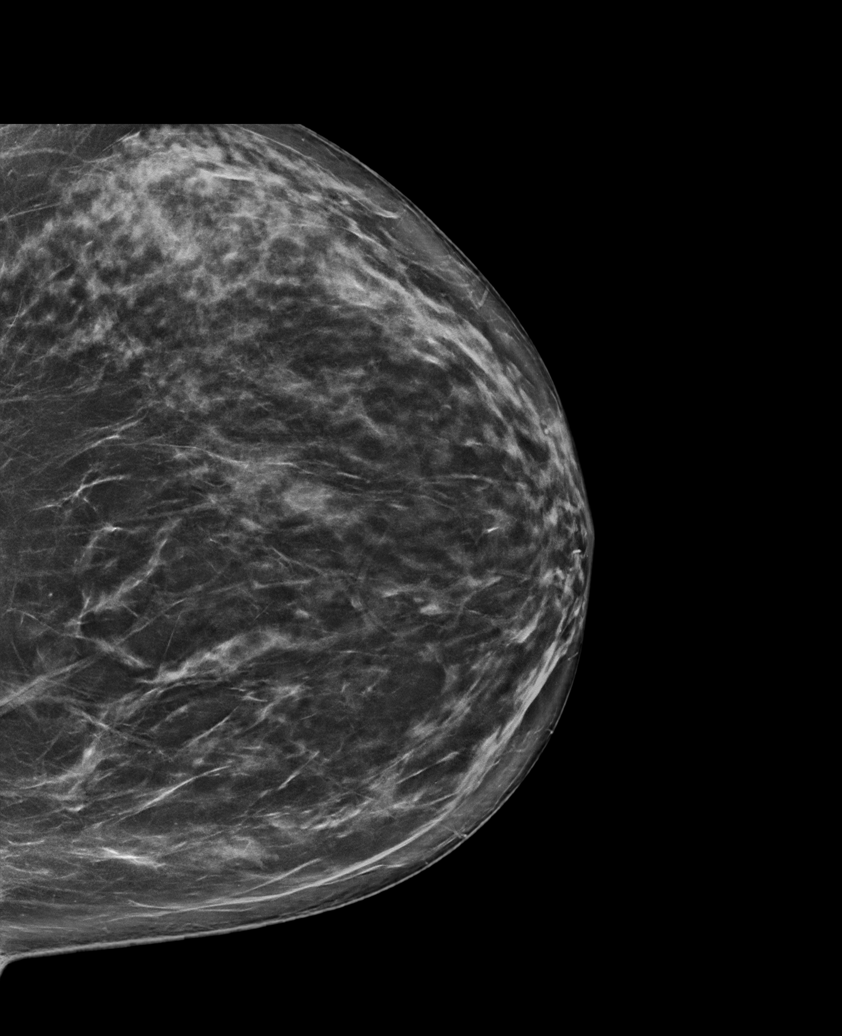

[R CC synth-2D (1 of 2)]
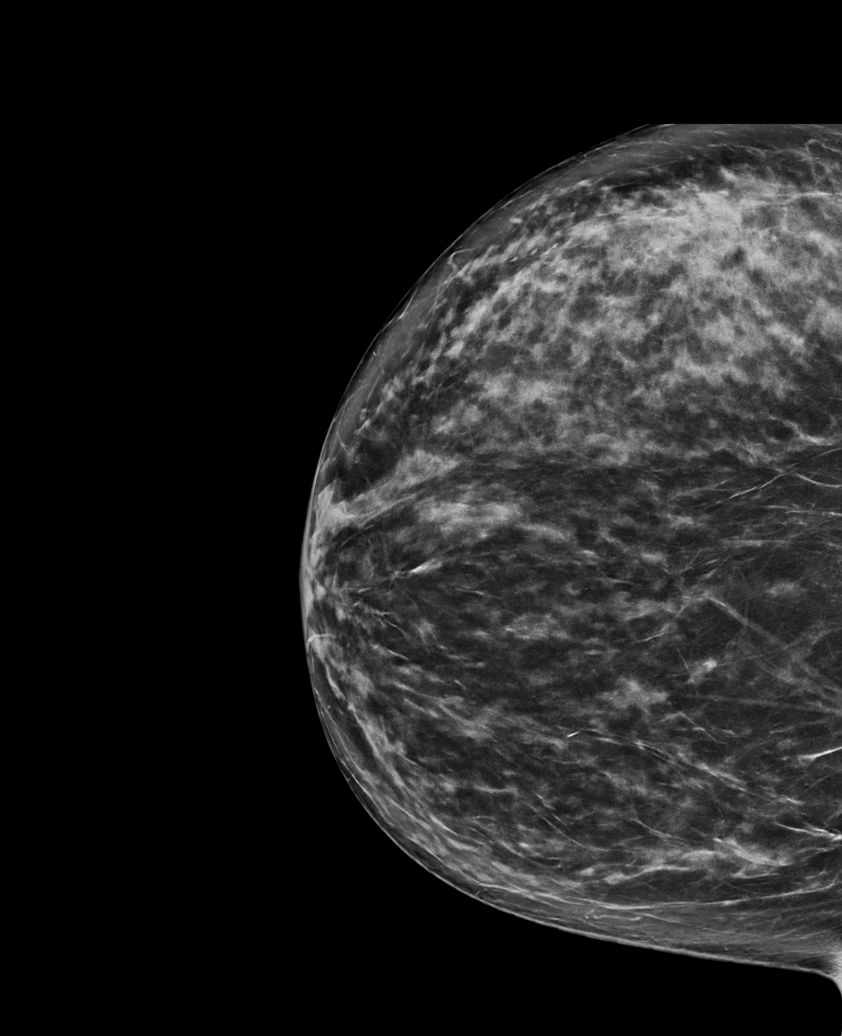

[R CC synth-2D (2 of 2)]
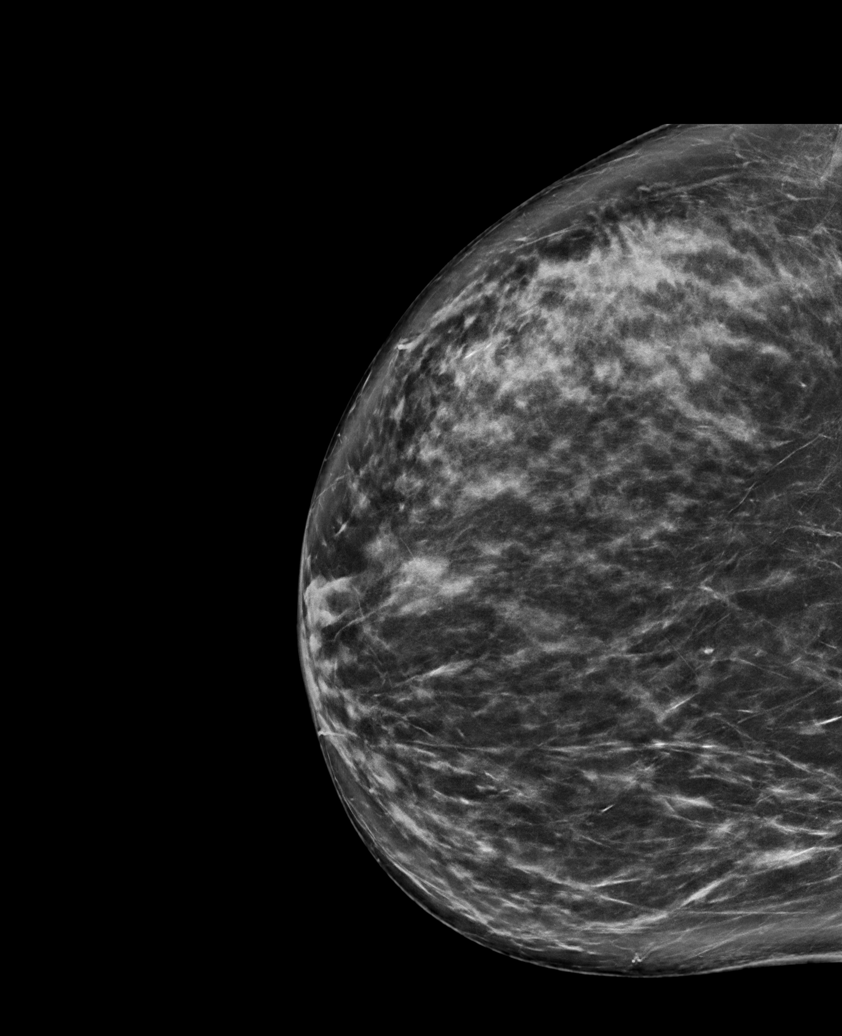

[L MLO synth-2D]
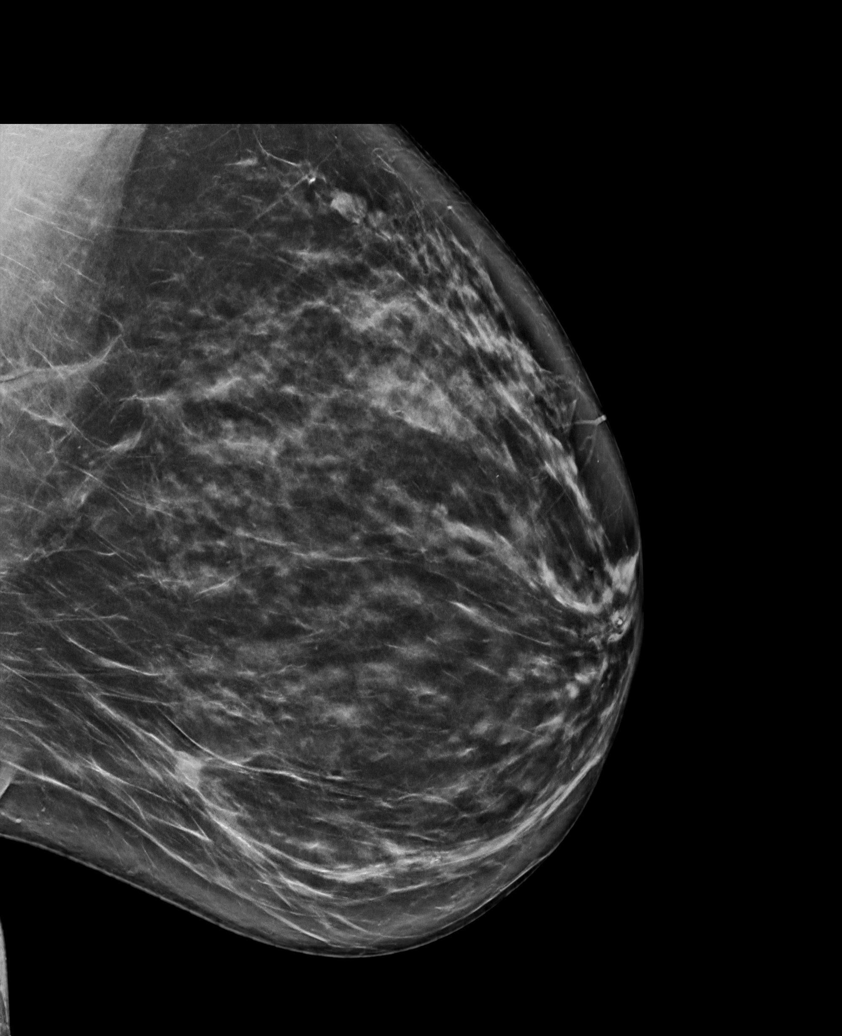

[R MLO tomo · tomo slice 45/88.0]
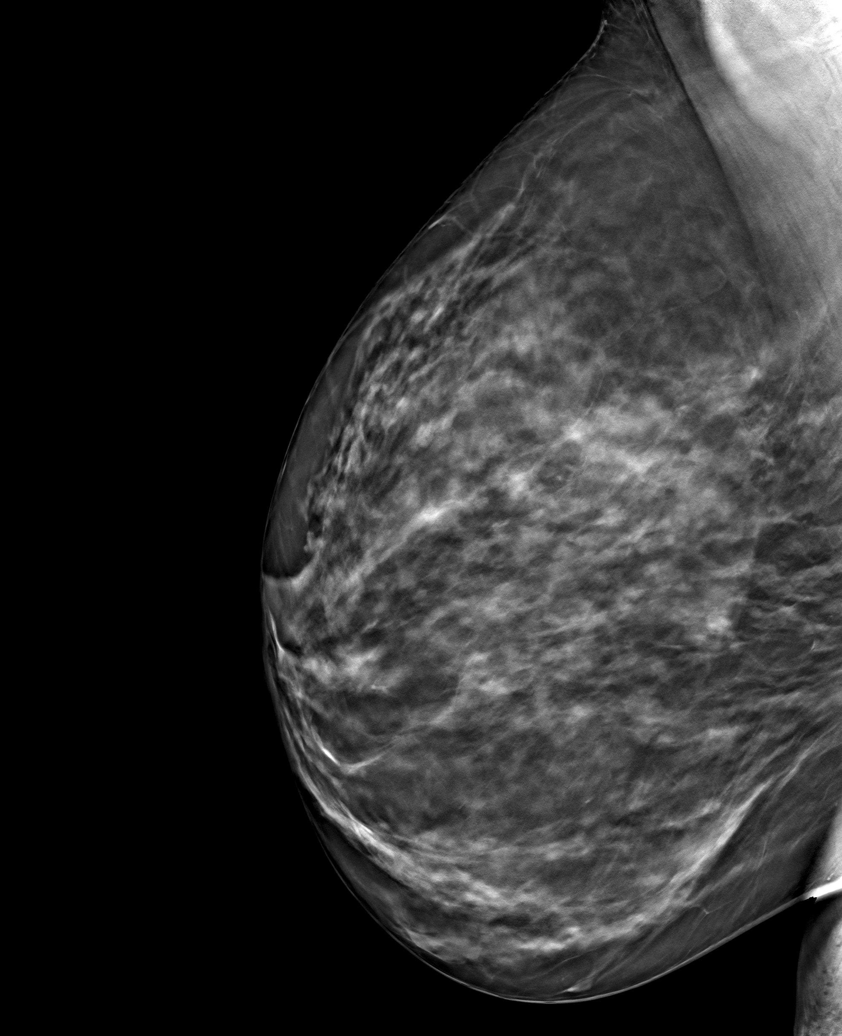

[6 of 30 positions shown; findings below may reference images not displayed]

ACR Breast Density Category c: The breast tissue is heterogeneously
dense, which may obscure small masses.
FINDINGS: There are no findings suspicious for malignancy.
IMPRESSION: No mammographic evidence of malignancy. A result letter of this
screening mammogram will be mailed directly to the patient.

RECOMMENDATION:
Screening mammogram in one year. (Code:Q3-W-BC3)

BI-RADS CATEGORY  1: Negative.

## 2022-05-16 ENCOUNTER — Other Ambulatory Visit: Payer: Self-pay | Admitting: Obstetrics and Gynecology

## 2022-05-16 DIAGNOSIS — N951 Menopausal and female climacteric states: Secondary | ICD-10-CM

## 2022-05-19 NOTE — Telephone Encounter (Signed)
Med refill request: Angela Glover Last AEX: 02/06/22 Next appt: 06/04/22 Last MMG (if hormonal med) 05/21/21 BI-RADS CAT 1 neg Refill authorized: Please Advise?

## 2022-06-04 ENCOUNTER — Telehealth (INDEPENDENT_AMBULATORY_CARE_PROVIDER_SITE_OTHER): Payer: BC Managed Care – PPO | Admitting: Obstetrics and Gynecology

## 2022-06-04 ENCOUNTER — Ambulatory Visit: Payer: BC Managed Care – PPO | Admitting: Obstetrics and Gynecology

## 2022-06-04 ENCOUNTER — Encounter: Payer: Self-pay | Admitting: Obstetrics and Gynecology

## 2022-06-04 DIAGNOSIS — N951 Menopausal and female climacteric states: Secondary | ICD-10-CM | POA: Diagnosis not present

## 2022-06-04 NOTE — Progress Notes (Signed)
Virtual Visit via Video Note  I connected with Angela Glover on 06/04/22 at  4:30 PM EDT by a video enabled telemedicine application and verified that I am speaking with the correct person using two identifiers.  Location: Patient: Work Restaurant manager, fast food: Paramedic at [employer]  GYNECOLOGY  VISIT   HPI: 52 y.o.   Married Black or [place] American Not Hispanic or Latino  female   [phone] with Patient's last menstrual period was 03/20/2019 (approximate).   here for f/u of ERT. In 12/23 she was started on the vivelle-dot 0.025 mg patch for severe vasomotor symptoms. At that time she was having ~5 hot flashes during the day and > 5 hot flashes/sweats at night.  Initially she was feeling better on the estradiol, then after a month her symptoms started coming back. She went off of the ERT.   Her primary started her on Paxil, which she feels is helping. Her hot flashes are controlled. Feeling well.   She has improved her working situation, down to a 40 hour week.   GYNECOLOGIC HISTORY: Patient's last menstrual period was 03/20/2019 (approximate). Contraception: hysterectomy  Menopausal hormone therapy: ERT        OB History     Gravida  2   Para  0   Term  0   Preterm  0   AB  2   Living  0      SAB  1   IAB      Ectopic  1   Multiple      Live Births                 Patient Active Problem List   Diagnosis Date Noted   SBO (small bowel obstruction) 04/09/2019   Status post laparoscopic hysterectomy 03/28/2019   Asthma 03/25/2017   Knee pain 10/16/2016   Low back pain 10/16/2016    Past Medical History:  Diagnosis Date   Abnormal Pap smear of cervix    Anemia    Anxiety    Asthma    exercise induced   Endometriosis    Headache    migraines-occassionally when does not have glasses on   Infertility, female    Small bowel obstruction [facility])     Past Surgical History:  Procedure Laterality Date   CYSTOSCOPY N/A 03/28/2019   Procedure: CYSTOSCOPY;  Surgeon:  Romualdo Bolk, MD;  Location: [facility];  Service: Gynecology;  Laterality: N/A;   SALPINGECTOMY     left due to ectopic, laparotomy. Endometriosis noted.    SMALL INTESTINE SURGERY     TOTAL LAPAROSCOPIC HYSTERECTOMY WITH SALPINGECTOMY Bilateral 03/28/2019   Procedure: TOTAL LAPAROSCOPIC HYSTERECTOMY WITH RIGHT SALPINGECTOMY/LEFT OVARIAN CYSTECTOMY WITH LEFT OOPHERECTOMY;  Surgeon: Romualdo Bolk, MD;  Location: [facility];  Service: Gynecology;  Laterality: Bilateral;  LEFT OVARIAN CYSTECTOMY WITH POSSIBLE LEFT OOPHERECTOMY/EXTENDED RECOVERY BED/3 hour surgery time/ks    Current Outpatient Medications  Medication Sig Dispense Refill   albuterol (VENTOLIN HFA) 108 (90 Base) MCG/ACT inhaler Inhale 2 puffs into the lungs every 6 (six) hours as needed for wheezing or shortness of breath.     estradiol (VIVELLE-DOT) 0.025 MG/24HR APPLY 1 PATCH TOPICALLY TWICE A WEEK 8 patch 0   fluticasone (FLONASE) 50 MCG/ACT nasal spray Place 1 spray into both nostrils daily.     ibuprofen (ADVIL) 800 MG tablet Take 800 mg by mouth 3 (three) times daily.     PARoxetine (PAXIL) 20 MG tablet TAKE 1 TABLET BY MOUTH  ONCE DAILY FOR 90 DAYS     No current facility-administered medications for this visit.     ALLERGIES: Oxycodone and Latex  Family History  Problem Relation Age of Onset   Breast cancer Maternal Aunt        does not know age   Colon cancer Maternal Aunt        dx in late 60s   Stomach cancer Maternal Grandmother        around 29 at dx   Breast cancer Paternal Aunt    Esophageal cancer Neg Hx    Pancreatic cancer Neg Hx    Liver cancer Neg Hx     Social History   Socioeconomic History   Marital status: Married    Spouse name: Not on file   Number of children: Not on file   Years of education: Not on file   Highest education level: Not on file  Occupational History   Not on file  Tobacco Use   Smoking status: Never   Smokeless tobacco:  Never  Vaping Use   Vaping Use: Never used  Substance and Sexual Activity   Alcohol use: No   Drug use: No   Sexual activity: Yes    Partners: Male    Birth control/protection: Surgical    Comment: Hyst  Other Topics Concern   Not on file  Social History Narrative   The patient is married, no children.  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   She does not use alcohol she has never smoked or used tobacco and she does not use drugs.   Social Determinants of Health   Financial Resource Strain: Not on file  Food Insecurity: Not on file  Transportation Needs: Not on file  Physical Activity: Not on file  Stress: Not on file  Social Connections: Not on file  Intimate Partner Violence: Not on file    ROS  PHYSICAL EXAMINATION:    LMP 03/20/2019 (Approximate)     General appearance: alert, cooperative and appears stated age  84. Vasomotor symptoms due to menopause Initially helped with low dose ERT, then symptoms returned. Primary started her on Paxil and she is doing very well. No longer on the estradiol patch. -She will continue on the paxil -Reach out with any changes or concerns

## 2022-06-13 DIAGNOSIS — Z1231 Encounter for screening mammogram for malignant neoplasm of breast: Secondary | ICD-10-CM

## 2022-06-27 ENCOUNTER — Ambulatory Visit
Admission: RE | Admit: 2022-06-27 | Discharge: 2022-06-27 | Disposition: A | Discharge status: Home / Self Care | Payer: BC Managed Care – PPO | Source: Ambulatory Visit | Attending: Obstetrics and Gynecology | Admitting: Obstetrics and Gynecology

## 2022-06-27 DIAGNOSIS — Z1231 Encounter for screening mammogram for malignant neoplasm of breast: Secondary | ICD-10-CM

## 2022-10-24 ENCOUNTER — Ambulatory Visit
Admission: RE | Admit: 2022-10-24 | Discharge: 2022-10-24 | Disposition: A | Discharge status: Home / Self Care | Payer: 59 | Source: Ambulatory Visit | Attending: Internal Medicine | Admitting: Internal Medicine

## 2022-10-24 VITALS — BP 131/89 | HR 104 | Temp 99.8°F | Resp 20

## 2022-10-24 DIAGNOSIS — J069 Acute upper respiratory infection, unspecified: Secondary | ICD-10-CM | POA: Diagnosis not present

## 2022-10-24 DIAGNOSIS — U071 COVID-19: Secondary | ICD-10-CM | POA: Diagnosis present

## 2022-10-24 DIAGNOSIS — J4521 Mild intermittent asthma with (acute) exacerbation: Secondary | ICD-10-CM | POA: Diagnosis not present

## 2022-10-24 MED ORDER — PREDNISONE 20 MG PO TABS: 40.0000 mg | ORAL_TABLET | Freq: Every day | ORAL | 0 refills | Status: AC

## 2022-10-24 NOTE — ED Triage Notes (Signed)
Pt states congestion for the past 2 days with coughing and watery eyes for the past 2 days.  States she has been using her inhaler and nasal spray, and she took a sudafed today but she is not feeling any better.

## 2022-10-24 NOTE — ED Provider Notes (Signed)
[facility]    CSN: [id number] Arrival date & time: 10/24/22  1741      History   Chief Complaint Chief Complaint  Patient presents with   Nasal Congestion    HPI Angela Glover is a 52 y.o. female.   Patient presents with nasal congestion, coughing, watery eyes that started about 2 to 3 days ago.  Patient denies any known sick contacts or any fever.  Patient has taken Sudafed and used her albuterol inhaler with minimal improvement.  Reports intermittent shortness of breath and history of asthma.  Reports albuterol inhaler has not been very helpful.     Past Medical History:  Diagnosis Date   Abnormal Pap smear of cervix    Anemia    Anxiety    Asthma    exercise induced   Endometriosis    Headache    migraines-occassionally when does not have glasses on   Infertility, female    Small bowel obstruction [facility])     Patient Active Problem List   Diagnosis Date Noted   SBO (small bowel obstruction) (HCC) 04/09/2019   Status post laparoscopic hysterectomy 03/28/2019   Asthma 03/25/2017   Knee pain 10/16/2016   Low back pain 10/16/2016    Past Surgical History:  Procedure Laterality Date   CYSTOSCOPY N/A 03/28/2019   Procedure: CYSTOSCOPY;  Surgeon: Romualdo Bolk, MD;  Location: [facility];  Service: Gynecology;  Laterality: N/A;   SALPINGECTOMY     left due to ectopic, laparotomy. Endometriosis noted.    SMALL INTESTINE SURGERY     TOTAL LAPAROSCOPIC HYSTERECTOMY WITH SALPINGECTOMY Bilateral 03/28/2019   Procedure: TOTAL LAPAROSCOPIC HYSTERECTOMY WITH RIGHT SALPINGECTOMY/LEFT OVARIAN CYSTECTOMY WITH LEFT OOPHERECTOMY;  Surgeon: Romualdo Bolk, MD;  Location: [facility];  Service: Gynecology;  Laterality: Bilateral;  LEFT OVARIAN CYSTECTOMY WITH POSSIBLE LEFT OOPHERECTOMY/EXTENDED RECOVERY BED/3 hour surgery time/ks    OB History     Gravida  2   Para  0   Term  0   Preterm  0   AB  2    Living  0      SAB  1   IAB      Ectopic  1   Multiple      Live Births               Home Medications    Prior to Admission medications   Medication Sig Start Date End Date Taking? Authorizing Provider  predniSONE (DELTASONE) 20 MG tablet Take 2 tablets (40 mg total) by mouth daily for 5 days. 10/24/22 10/29/22 Yes Deivi Huckins, Acie Fredrickson, FNP  albuterol (VENTOLIN HFA) 108 (90 Base) MCG/ACT inhaler Inhale 2 puffs into the lungs every 6 (six) hours as needed for wheezing or shortness of breath.    [provider]  fluticasone (FLONASE) 50 MCG/ACT nasal spray Place 1 spray into both nostrils daily.    [provider]  ibuprofen (ADVIL) 800 MG tablet Take 800 mg by mouth 3 (three) times daily. 09/27/20   [provider]  PARoxetine (PAXIL) 20 MG tablet TAKE 1 TABLET BY MOUTH ONCE DAILY FOR 90 DAYS    [provider]    Family History Family History  Problem Relation Age of Onset   Breast cancer Maternal Aunt        does not know age   Colon cancer Maternal Aunt        dx in late 20s   Stomach cancer Maternal Grandmother  around 78 at dx   Breast cancer Paternal Aunt    Esophageal cancer Neg Hx    Pancreatic cancer Neg Hx    Liver cancer Neg Hx     Social History Social History   Tobacco Use   Smoking status: Never   Smokeless tobacco: Never  Vaping Use   Vaping status: Never Used  Substance Use Topics   Alcohol use: No   Drug use: No     Allergies   Oxycodone and Latex   Review of Systems Review of Systems Per HPI  Physical Exam Triage Vital Signs ED Triage Vitals  Encounter Vitals Group     BP 10/24/22 1812 131/89     Systolic BP Percentile --      Diastolic BP Percentile --      Pulse Rate 10/24/22 1812 (!) 104     Resp 10/24/22 1812 20     Temp 10/24/22 1812 99.8 F (37.7 C)     Temp Source 10/24/22 1812 Oral     SpO2 10/24/22 1812 99 %     Weight --      Height --      Head Circumference --      Peak  Flow --      Pain Score 10/24/22 1819 0     Pain Loc --      Pain Education --      Exclude from Growth Chart --    No data found.  Updated Vital Signs BP 131/89 (BP Location: Left Arm)   Pulse (!) 104   Temp 99.8 F (37.7 C) (Oral)   Resp 20   LMP 03/20/2019 (Approximate)   SpO2 99%   Visual Acuity Right Eye Distance:   Left Eye Distance:   Bilateral Distance:    Right Eye Near:   Left Eye Near:    Bilateral Near:     Physical Exam Constitutional:      General: She is not in acute distress.    Appearance: Normal appearance. She is not toxic-appearing or diaphoretic.  HENT:     Head: Normocephalic and atraumatic.     Right Ear: Tympanic membrane and ear canal normal.     Left Ear: Tympanic membrane and ear canal normal.     Nose: Congestion present.     Mouth/Throat:     Mouth: Mucous membranes are moist.     Pharynx: Posterior oropharyngeal erythema present.  Eyes:     Extraocular Movements: Extraocular movements intact.     Conjunctiva/sclera: Conjunctivae normal.     Pupils: Pupils are equal, round, and reactive to light.  Cardiovascular:     Rate and Rhythm: Normal rate and regular rhythm.     Pulses: Normal pulses.     Heart sounds: Normal heart sounds.  Pulmonary:     Effort: Pulmonary effort is normal. No respiratory distress.     Breath sounds: Normal breath sounds. No stridor. No wheezing, rhonchi or rales.  Abdominal:     General: Abdomen is flat. Bowel sounds are normal.     Palpations: Abdomen is soft.  Musculoskeletal:        General: Normal range of motion.     Cervical back: Normal range of motion.  Skin:    General: Skin is warm and dry.  Neurological:     General: No focal deficit present.     Mental Status: She is alert and oriented to person, place, and time. Mental status is at baseline.  Psychiatric:  Mood and Affect: Mood normal.        Behavior: Behavior normal.      UC Treatments / Results  Labs (all labs ordered are  listed, but only abnormal results are displayed) Labs Reviewed  SARS CORONAVIRUS 2 (TAT 6-24 HRS)    EKG   Radiology No results found.  Procedures Procedures (including critical care time)  Medications Ordered in UC Medications - No data to display  Initial Impression / Assessment and Plan / UC Course  I have reviewed the triage vital signs and the nursing notes.  Pertinent labs & imaging results that were available during my care of the patient were reviewed by me and considered in my medical decision making (see chart for details).     Patient presents with symptoms likely from a viral upper respiratory infection. Do not suspect underlying cardiopulmonary process.  Suspect possible mild asthma exacerbation.  Patient is not tachypneic and oxygen is normal so do not think that emergent evaluation or chest imaging is necessary.  Patient is nontoxic appearing and not in need of emergent medical intervention.  Covid test pending.  Will prescribe prednisone steroid burst to help alleviate symptoms.  Return if symptoms fail to improve.. Patient states understanding and is agreeable.  Discharged with PCP followup.  Final Clinical Impressions(s) / UC Diagnoses   Final diagnoses:  Viral upper respiratory tract infection with cough  Mild intermittent asthma with acute exacerbation     Discharge Instructions      Suspect that you have a viral illness that is causing a flareup of your asthma. I have prescribed prednisone to help alleviate this.  COVID test is pending.    ED Prescriptions     Medication Sig Dispense Auth. Provider   predniSONE (DELTASONE) 20 MG tablet Take 2 tablets (40 mg total) by mouth daily for 5 days. 10 tablet Gustavus Bryant, [place]      PDMP not reviewed this encounter.   Gustavus Bryant, [place] 10/24/22 [phone]

## 2022-10-24 NOTE — Discharge Instructions (Addendum)
Suspect that you have a viral illness that is causing a flareup of your asthma. I have prescribed prednisone to help alleviate this.  COVID test is pending.

## 2022-10-25 LAB — SARS CORONAVIRUS 2 (TAT 6-24 HRS): SARS Coronavirus 2: POSITIVE — AB

## 2023-07-02 ENCOUNTER — Other Ambulatory Visit: Payer: Self-pay | Admitting: Obstetrics and Gynecology

## 2023-07-02 ENCOUNTER — Ambulatory Visit
Admission: RE | Admit: 2023-07-02 | Discharge: 2023-07-02 | Disposition: A | Discharge status: Home / Self Care | Source: Ambulatory Visit | Attending: Obstetrics and Gynecology | Admitting: Obstetrics and Gynecology

## 2023-07-02 DIAGNOSIS — Z1231 Encounter for screening mammogram for malignant neoplasm of breast: Secondary | ICD-10-CM
